# Patient Record
Sex: Female | Born: 1975 | Race: Black or African American | Hispanic: No | Marital: Single | Smoking: Never smoker
Health system: Southern US, Community
[De-identification: ages and names within clinical notes are randomized; demographics above are authoritative.]

## PROBLEM LIST (undated history)

## (undated) DIAGNOSIS — T7840XA Allergy, unspecified, initial encounter: Secondary | ICD-10-CM

## (undated) DIAGNOSIS — S0990XA Unspecified injury of head, initial encounter: Secondary | ICD-10-CM

## (undated) HISTORY — PX: NASAL SINUS SURGERY: SHX719

## (undated) HISTORY — PX: BRAIN SURGERY: SHX531

## (undated) HISTORY — DX: Allergy, unspecified, initial encounter: T78.40XA

## (undated) HISTORY — PX: OPEN TREATMENT ZYGOMATIC ARCH FRACTURE: SUR912

## (undated) HISTORY — PX: OTHER SURGICAL HISTORY: SHX169

---

## 1998-01-19 ENCOUNTER — Emergency Department (HOSPITAL_COMMUNITY): Admission: EM | Admit: 1998-01-19 | Discharge: 1998-01-19 | Payer: Self-pay | Admitting: Emergency Medicine

## 1999-10-18 ENCOUNTER — Encounter: Payer: Self-pay | Admitting: Family Medicine

## 1999-10-18 ENCOUNTER — Encounter: Admission: RE | Admit: 1999-10-18 | Discharge: 1999-10-18 | Payer: Self-pay | Admitting: Family Medicine

## 1999-10-19 ENCOUNTER — Encounter: Payer: Self-pay | Admitting: Family Medicine

## 1999-10-19 ENCOUNTER — Encounter: Admission: RE | Admit: 1999-10-19 | Discharge: 1999-10-19 | Payer: Self-pay | Admitting: Family Medicine

## 2001-01-09 ENCOUNTER — Encounter: Payer: Self-pay | Admitting: General Surgery

## 2001-01-09 ENCOUNTER — Inpatient Hospital Stay (HOSPITAL_COMMUNITY): Admission: AC | Admit: 2001-01-09 | Discharge: 2001-01-29 | Payer: Self-pay

## 2001-01-10 ENCOUNTER — Encounter: Payer: Self-pay | Admitting: General Surgery

## 2001-01-11 ENCOUNTER — Encounter: Payer: Self-pay | Admitting: Otolaryngology

## 2001-01-11 ENCOUNTER — Encounter: Payer: Self-pay | Admitting: General Surgery

## 2001-01-12 ENCOUNTER — Encounter: Payer: Self-pay | Admitting: General Surgery

## 2001-01-13 ENCOUNTER — Encounter: Payer: Self-pay | Admitting: General Surgery

## 2001-01-13 ENCOUNTER — Encounter: Payer: Self-pay | Admitting: Neurosurgery

## 2001-01-14 ENCOUNTER — Encounter: Payer: Self-pay | Admitting: General Surgery

## 2001-01-15 ENCOUNTER — Encounter: Payer: Self-pay | Admitting: General Surgery

## 2001-01-16 ENCOUNTER — Encounter: Payer: Self-pay | Admitting: General Surgery

## 2001-01-17 ENCOUNTER — Encounter: Payer: Self-pay | Admitting: General Surgery

## 2001-01-19 ENCOUNTER — Encounter: Payer: Self-pay | Admitting: General Surgery

## 2001-01-21 ENCOUNTER — Encounter: Payer: Self-pay | Admitting: General Surgery

## 2001-01-23 ENCOUNTER — Encounter: Payer: Self-pay | Admitting: General Surgery

## 2001-01-24 ENCOUNTER — Encounter: Payer: Self-pay | Admitting: General Surgery

## 2001-01-26 ENCOUNTER — Encounter: Payer: Self-pay | Admitting: General Surgery

## 2001-01-29 ENCOUNTER — Encounter: Payer: Self-pay | Admitting: General Surgery

## 2001-01-29 ENCOUNTER — Inpatient Hospital Stay (HOSPITAL_COMMUNITY)
Admission: RE | Admit: 2001-01-29 | Discharge: 2001-03-16 | Payer: Self-pay | Admitting: Physical Medicine and Rehabilitation

## 2001-02-07 ENCOUNTER — Encounter: Payer: Self-pay | Admitting: Physical Medicine & Rehabilitation

## 2001-02-11 ENCOUNTER — Encounter: Payer: Self-pay | Admitting: Physical Medicine and Rehabilitation

## 2001-03-23 ENCOUNTER — Encounter
Admission: RE | Admit: 2001-03-23 | Discharge: 2001-04-30 | Payer: Self-pay | Admitting: Physical Medicine & Rehabilitation

## 2001-10-07 ENCOUNTER — Other Ambulatory Visit: Admission: RE | Admit: 2001-10-07 | Discharge: 2001-10-07 | Payer: Self-pay | Admitting: Family Medicine

## 2002-06-23 ENCOUNTER — Other Ambulatory Visit: Admission: RE | Admit: 2002-06-23 | Discharge: 2002-06-23 | Payer: Self-pay | Admitting: Family Medicine

## 2003-06-28 ENCOUNTER — Other Ambulatory Visit: Admission: RE | Admit: 2003-06-28 | Discharge: 2003-06-28 | Payer: Self-pay | Admitting: Family Medicine

## 2004-06-18 ENCOUNTER — Emergency Department (HOSPITAL_COMMUNITY): Admission: EM | Admit: 2004-06-18 | Discharge: 2004-06-18 | Payer: Self-pay | Admitting: Emergency Medicine

## 2004-06-29 ENCOUNTER — Other Ambulatory Visit: Admission: RE | Admit: 2004-06-29 | Discharge: 2004-06-29 | Payer: Self-pay | Admitting: Family Medicine

## 2005-08-23 ENCOUNTER — Other Ambulatory Visit: Admission: RE | Admit: 2005-08-23 | Discharge: 2005-08-23 | Payer: Self-pay | Admitting: Family Medicine

## 2006-09-12 ENCOUNTER — Other Ambulatory Visit: Admission: RE | Admit: 2006-09-12 | Discharge: 2006-09-12 | Payer: Self-pay | Admitting: Family Medicine

## 2007-02-24 ENCOUNTER — Emergency Department (HOSPITAL_COMMUNITY): Admission: EM | Admit: 2007-02-24 | Discharge: 2007-02-24 | Payer: Self-pay | Admitting: Emergency Medicine

## 2007-10-23 ENCOUNTER — Other Ambulatory Visit: Admission: RE | Admit: 2007-10-23 | Discharge: 2007-10-23 | Payer: Self-pay | Admitting: Family Medicine

## 2008-10-04 ENCOUNTER — Other Ambulatory Visit: Admission: RE | Admit: 2008-10-04 | Discharge: 2008-10-04 | Payer: Self-pay | Admitting: Family Medicine

## 2009-10-06 ENCOUNTER — Other Ambulatory Visit: Admission: RE | Admit: 2009-10-06 | Discharge: 2009-10-06 | Payer: Self-pay | Admitting: Family Medicine

## 2010-05-22 ENCOUNTER — Encounter
Admission: RE | Admit: 2010-05-22 | Discharge: 2010-05-22 | Payer: Self-pay | Source: Home / Self Care | Attending: Family Medicine | Admitting: Family Medicine

## 2010-10-26 ENCOUNTER — Other Ambulatory Visit: Payer: Self-pay | Admitting: Family Medicine

## 2010-10-26 ENCOUNTER — Other Ambulatory Visit (HOSPITAL_COMMUNITY)
Admission: RE | Admit: 2010-10-26 | Discharge: 2010-10-26 | Disposition: A | Discharge status: Home / Self Care | Payer: Managed Care, Other (non HMO) | Source: Ambulatory Visit | Attending: Family Medicine | Admitting: Family Medicine

## 2010-10-26 DIAGNOSIS — Z124 Encounter for screening for malignant neoplasm of cervix: Secondary | ICD-10-CM | POA: Insufficient documentation
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
## 2011-09-08 ENCOUNTER — Emergency Department (HOSPITAL_COMMUNITY)
Admission: EM | Admit: 2011-09-08 | Discharge: 2011-09-08 | Disposition: A | Discharge status: Home / Self Care | Payer: No Typology Code available for payment source | Attending: Emergency Medicine | Admitting: Emergency Medicine

## 2011-09-08 ENCOUNTER — Encounter (HOSPITAL_COMMUNITY): Payer: Self-pay

## 2011-09-08 DIAGNOSIS — Y92410 Unspecified street and highway as the place of occurrence of the external cause: Secondary | ICD-10-CM | POA: Insufficient documentation

## 2011-09-08 DIAGNOSIS — S139XXA Sprain of joints and ligaments of unspecified parts of neck, initial encounter: Secondary | ICD-10-CM | POA: Insufficient documentation

## 2011-09-08 DIAGNOSIS — R071 Chest pain on breathing: Secondary | ICD-10-CM | POA: Insufficient documentation

## 2011-09-08 DIAGNOSIS — S161XXA Strain of muscle, fascia and tendon at neck level, initial encounter: Secondary | ICD-10-CM

## 2011-09-08 DIAGNOSIS — R0789 Other chest pain: Secondary | ICD-10-CM

## 2011-09-08 HISTORY — DX: Unspecified injury of head, initial encounter: S09.90XA

## 2011-09-08 MED ORDER — HYDROCODONE-ACETAMINOPHEN 5-325 MG PO TABS: 1.0000 | ORAL_TABLET | ORAL | Status: AC | PRN

## 2011-09-08 MED ORDER — IBUPROFEN 400 MG PO TABS: 400.0000 mg | ORAL_TABLET | Freq: Three times a day (TID) | ORAL | Status: AC

## 2011-09-08 MED ORDER — HYDROCODONE-ACETAMINOPHEN 5-325 MG PO TABS
1.0000 | ORAL_TABLET | Freq: Once | ORAL | Status: AC
  Administered 2011-09-08: 1 via ORAL
  Filled 2011-09-08: qty 1
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
## 2011-09-08 NOTE — ED Notes (Signed)
Pt is not in the general waiting area or triage. Unable to locate and has been called for multiple times
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
## 2011-09-10 ENCOUNTER — Emergency Department (HOSPITAL_COMMUNITY): Payer: No Typology Code available for payment source

## 2011-09-10 ENCOUNTER — Emergency Department (HOSPITAL_COMMUNITY)
Admission: EM | Admit: 2011-09-10 | Discharge: 2011-09-10 | Disposition: A | Discharge status: Home / Self Care | Payer: No Typology Code available for payment source | Attending: Emergency Medicine | Admitting: Emergency Medicine

## 2011-09-10 ENCOUNTER — Encounter (HOSPITAL_COMMUNITY): Payer: Self-pay | Admitting: Emergency Medicine

## 2011-09-10 DIAGNOSIS — R071 Chest pain on breathing: Secondary | ICD-10-CM | POA: Insufficient documentation

## 2011-09-10 DIAGNOSIS — R0789 Other chest pain: Secondary | ICD-10-CM

## 2011-09-10 DIAGNOSIS — Z79899 Other long term (current) drug therapy: Secondary | ICD-10-CM | POA: Insufficient documentation

## 2011-09-10 DIAGNOSIS — M25519 Pain in unspecified shoulder: Secondary | ICD-10-CM | POA: Insufficient documentation
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
## 2011-09-10 NOTE — ED Notes (Signed)
Patient returned from X-ray 

## 2011-09-10 NOTE — ED Notes (Signed)
Patient transported to X-ray 
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
## 2011-09-11 NOTE — ED Provider Notes (Signed)
Medical screening examination/treatment/procedure(s) were performed by non-physician practitioner and as supervising physician I was immediately available for consultation/collaboration.  Ethelda Chick, MD 09/11/11 [phone]

## 2011-11-11 ENCOUNTER — Encounter: Payer: Self-pay | Admitting: Emergency Medicine

## 2011-11-11 ENCOUNTER — Other Ambulatory Visit (HOSPITAL_COMMUNITY)
Admission: RE | Admit: 2011-11-11 | Discharge: 2011-11-11 | Disposition: A | Discharge status: Home / Self Care | Payer: Managed Care, Other (non HMO) | Source: Ambulatory Visit | Attending: Family Medicine | Admitting: Family Medicine

## 2011-11-11 ENCOUNTER — Ambulatory Visit (INDEPENDENT_AMBULATORY_CARE_PROVIDER_SITE_OTHER): Payer: Managed Care, Other (non HMO) | Admitting: Emergency Medicine

## 2011-11-11 VITALS — BP 110/74 | HR 76 | Temp 98.3°F | Ht 61.5 in | Wt 114.5 lb

## 2011-11-11 DIAGNOSIS — Z113 Encounter for screening for infections with a predominantly sexual mode of transmission: Secondary | ICD-10-CM | POA: Insufficient documentation

## 2011-11-11 DIAGNOSIS — Z124 Encounter for screening for malignant neoplasm of cervix: Secondary | ICD-10-CM

## 2011-11-11 DIAGNOSIS — Z0000 Encounter for general adult medical examination without abnormal findings: Secondary | ICD-10-CM

## 2011-11-11 DIAGNOSIS — Z01419 Encounter for gynecological examination (general) (routine) without abnormal findings: Secondary | ICD-10-CM | POA: Insufficient documentation

## 2011-11-11 NOTE — Patient Instructions (Signed)
It was nice to meet you!  Everything looks good today.  We will check some labs today.  You should receive a letter in the mail in the next 2 weeks with the results.  If anything is wrong, I will give you a call.  Please schedule an appointment either with me or the [facility] for IUD placement.

## 2011-11-11 NOTE — Assessment & Plan Note (Signed)
Doing well.  Will check lipid, HIV, and CMP.  Pap collected today.  Patient interested in IUD for birth control. Discussed risks and benefits as well as other options.  Patient would like to proceed with Mirena IUD.  She is to schedule an appointment with me or [facility] for placement.

## 2011-11-11 NOTE — Progress Notes (Signed)
  Subjective:    Patient ID: Katherine Woods, female    DOB: 1976/03/01, 36 y.o.   MRN: [id number]  HPI Katherine Woods is here for new patient appointment.  She has no acute concerns today, but would like to discuss birth control options.  I have reviewed and updated the following as appropriate: allergies, current medications, past family history, past medical history, past social history, past surgical history and problem list.  She is a never smoker, limited alcohol use.  Healthy weight.   Birth control: Interested in IUD.  Has been on the patch until 3 weeks ago.  Was on OCPs a long time ago, but had trouble remembering to take them.  LMP 3 weeks ago; no sex in 1 month.  No history of STDs.  No history of abnormal paps.  Regular periods.     Review of Systems  Constitutional: Negative for fever and appetite change.  Respiratory: Negative for shortness of breath.   Cardiovascular: Negative for chest pain.  Gastrointestinal: Negative for abdominal pain and diarrhea.  Genitourinary: Negative for difficulty urinating.  Musculoskeletal: Negative for myalgias and arthralgias.  Skin: Negative for rash.  Neurological: Negative for weakness and headaches.       Objective:   Physical Exam BP 110/74  Pulse 76  Temp(Src) 98.3 F (36.8 C) (Oral)  Ht 5' 1.5" (1.562 m)  Wt 114 lb 8 oz (51.937 kg)  BMI 21.28 kg/m2  LMP 10/30/2011 Gen: alert, cooperative, NAD HEENT: AT/[place], sclera white, EOMI, PERRL, MMM, no pharyngeal erythema or exudate Neck: no LAD, normal thyroid CV: RRR, no murmurs Pulm: CTAB, no wheezes or rales Abd: +BS, soft, NTND Ext: no edema, 2+ DP pulses Pelvic: normal external genitalia, normal vagina, normal cervix, moderate amount of white discharge present      Assessment & Plan:

## 2011-11-12 LAB — COMPREHENSIVE METABOLIC PANEL
ALT: 15 U/L (ref 0–35)
Alkaline Phosphatase: 41 U/L (ref 39–117)
CO2: 25 mEq/L (ref 19–32)
Creat: 0.66 mg/dL (ref 0.50–1.10)
Total Bilirubin: 0.5 mg/dL (ref 0.3–1.2)

## 2011-11-12 LAB — LIPID PANEL
HDL: 68 mg/dL (ref >39)
LDL Cholesterol: 159 mg/dL — ABNORMAL HIGH (ref 0–99)
Total CHOL/HDL Ratio: 3.5 Ratio
Triglycerides: 42 mg/dL (ref <150)

## 2011-11-12 LAB — HIV ANTIBODY (ROUTINE TESTING W REFLEX): HIV: NONREACTIVE

## 2011-11-13 ENCOUNTER — Encounter: Payer: Self-pay | Admitting: Emergency Medicine

## 2011-11-14 ENCOUNTER — Ambulatory Visit (INDEPENDENT_AMBULATORY_CARE_PROVIDER_SITE_OTHER): Payer: Managed Care, Other (non HMO) | Admitting: Family Medicine

## 2011-11-14 VITALS — BP 102/69 | HR 80 | Ht 61.5 in | Wt 114.0 lb

## 2011-11-14 DIAGNOSIS — Z309 Encounter for contraceptive management, unspecified: Secondary | ICD-10-CM

## 2011-11-14 LAB — POCT URINE PREGNANCY: Preg Test, Ur: NEGATIVE

## 2011-11-14 MED ORDER — LEVONORGESTREL 20 MCG/24HR IU IUD
INTRAUTERINE_SYSTEM | Freq: Once | INTRAUTERINE | Status: AC
  Administered 2011-11-14: 1 via INTRAUTERINE

## 2011-11-14 NOTE — Patient Instructions (Signed)
Please follow instructions below for IUD insertion and care after. For cramping, you may take over the counter Motrin or Tylenol as needed for pain. Schedule a follow up appointment with Dr. Elwyn Reach in 3-4 weeks to check for IUD strings.   Intrauterine Device Insertion Care After  Refer to this sheet in the next few weeks. These instructions provide you with information on caring for yourself after your procedure. Your caregiver may also give you more specific instructions. Your treatment has been planned according to current medical practices, but problems sometimes occur. Call your caregiver if you have any problems or questions after your procedure.  HOME CARE INSTRUCTIONS   Only take over-the-counter or prescription medicines for pain, discomfort, or fever as directed by your caregiver. Do not use aspirin. This may increase bleeding.   Check your IUD to make sure it is in place before you resume sexual activity. You should be able to feel the strings. If you cannot feel the strings, something may be wrong. The IUD may have fallen out of the uterus, or the uterus may have been punctured (perforated) during placement. Also, if the strings are getting longer, it may mean that the IUD is being forced out of the uterus. You no longer have full protection from pregnancy if any of these problems occur.   You may resume sexual intercourse if you are not having problems with the IUD. The IUD is considered immediately effective.   You may resume normal activities.   Keep all follow-up appointments to be sure your IUD has remained in place. After the first exam, yearly exams are advised, unless you cannot feel the strings of your IUD.   Continue to check that the IUD is still in place by feeling for the strings after every menstrual period.  SEEK MEDICAL CARE IF:   You have bleeding that is heavier or lasts longer than a normal menstrual cycle.   You have a fever.   You have increasing cramps or  abdominal pain not relieved with medicine.   You have abdominal pain that does not seem to be related to the same area of earlier cramping and pain.   You are lightheaded, unusually weak, or faint.   You have abnormal vaginal discharge or smells.   You have pain during sexual intercourse.   You cannot feel the IUD strings, or the IUD string has gotten longer.   You feel the IUD at the opening of the cervix in the vagina.   You think you are pregnant, or you miss your menstrual period.   The IUD string is hurting your sex partner.  Document Released: 01/23/2011 Document Revised: 05/16/2011 Document Reviewed: 01/23/2011 [facility] Patient Information 2012 [place], [place].

## 2011-11-19 NOTE — Progress Notes (Signed)
Patient ID: Katherine Woods, female   DOB: 1976-03-11, 36 y.o.   MRN: [id number] Here for IUD placement Denies pelvic pain or vaginal discharge Has no questions about IUD. IUD INSERTION: Patient given informed consent, signed copy in the chart..  Negative pregnancy confirmed.  Appropriate time out taken.   Sterile instruments and technique was used. Cervix brought into view with use of speculum and then cleansed three times with  betadine swabs.  A tenaculum was placed into the anterior lip of the cervix and a uterine sound was used to measure uterine size.   A mirena IUD was placed into the endometrial cavity, deployed and secured. The applicator was removed. The strings were trimmed to 2 centimeters.   There were no complications and the patient tolerated the procedure well.   She was given handouts for post procedure instructions and information about the IUD including a card with the time of recommended removal. Dr Vladimir Faster assisted.

## 2011-11-20 ENCOUNTER — Telehealth: Payer: Self-pay | Admitting: Emergency Medicine

## 2011-11-20 NOTE — Telephone Encounter (Signed)
Katherine Woods would like something in writing regarding result of her HIV test that she took at her visit.

## 2011-11-21 NOTE — Telephone Encounter (Signed)
Called pt and confirmed identity. Results up front for pick up. Katherine Woods, Katherine Woods

## 2011-12-05 ENCOUNTER — Encounter: Payer: Self-pay | Admitting: Emergency Medicine

## 2011-12-05 ENCOUNTER — Other Ambulatory Visit: Payer: Self-pay | Admitting: Emergency Medicine

## 2011-12-05 ENCOUNTER — Ambulatory Visit (INDEPENDENT_AMBULATORY_CARE_PROVIDER_SITE_OTHER): Payer: Managed Care, Other (non HMO) | Admitting: Emergency Medicine

## 2011-12-05 VITALS — BP 94/61 | HR 82 | Ht 61.5 in | Wt 120.0 lb

## 2011-12-05 DIAGNOSIS — Z30431 Encounter for routine checking of intrauterine contraceptive device: Secondary | ICD-10-CM

## 2011-12-05 NOTE — Progress Notes (Signed)
  Subjective:    Patient ID: Katherine Woods, female    DOB: [birth year], 36 y.o.   MRN: [id number]  HPI Katherine Woods is here for follow up of IUD placement.  IUD placed about 3 weeks ago.  No complaints.  Period started last Wednesday, still has a little spotting.  No increase in cramps.  Neither she nor her boyfriend have been able to feel the strings.   I have reviewed and updated the following as appropriate: allergies and current medications  Review of Systems See HPI    Objective:   Physical Exam BP 94/61  Pulse 82  Ht 5' 1.5" (1.562 m)  Wt 120 lb (54.432 kg)  BMI 22.31 kg/m2  LMP 11/28/2011 Gen: alert, cooperative, NAD Pelvic: external genitalia normal, vaginal normal, cervix visualized and appears normal, no strings seen      Assessment & Plan:

## 2011-12-05 NOTE — Patient Instructions (Addendum)
I could not see the IUD strings on exam.  We are going to set you up with an ultrasound at [facility] to confirm that the IUD is in place.  Follow up with me as needed.

## 2011-12-05 NOTE — Assessment & Plan Note (Addendum)
Tolerating IUD well; however, no strings visualized on exam.  Will get ultrasound to confirm placement.

## 2011-12-13 ENCOUNTER — Ambulatory Visit (HOSPITAL_COMMUNITY): Admission: RE | Admit: 2011-12-13 | Payer: Managed Care, Other (non HMO) | Source: Ambulatory Visit

## 2011-12-16 ENCOUNTER — Ambulatory Visit (HOSPITAL_COMMUNITY)
Admission: RE | Admit: 2011-12-16 | Discharge: 2011-12-16 | Disposition: A | Discharge status: Home / Self Care | Payer: Managed Care, Other (non HMO) | Source: Ambulatory Visit | Attending: Family Medicine | Admitting: Family Medicine

## 2011-12-16 ENCOUNTER — Ambulatory Visit (HOSPITAL_COMMUNITY): Payer: Managed Care, Other (non HMO)

## 2011-12-16 DIAGNOSIS — N83209 Unspecified ovarian cyst, unspecified side: Secondary | ICD-10-CM | POA: Insufficient documentation

## 2011-12-16 DIAGNOSIS — Z30431 Encounter for routine checking of intrauterine contraceptive device: Secondary | ICD-10-CM | POA: Insufficient documentation

## 2011-12-17 ENCOUNTER — Telehealth: Payer: Self-pay | Admitting: Emergency Medicine

## 2011-12-17 NOTE — Telephone Encounter (Signed)
Called and discussed results of ultrasound with patient.  Discussed expectations for future periods.  Answered patients questions.

## 2012-05-25 ENCOUNTER — Ambulatory Visit (INDEPENDENT_AMBULATORY_CARE_PROVIDER_SITE_OTHER): Payer: Managed Care, Other (non HMO) | Admitting: Family Medicine

## 2012-05-25 ENCOUNTER — Encounter: Payer: Self-pay | Admitting: Family Medicine

## 2012-05-25 VITALS — BP 102/56 | HR 86 | Temp 97.7°F | Ht 61.6 in | Wt 118.2 lb

## 2012-05-25 DIAGNOSIS — N898 Other specified noninflammatory disorders of vagina: Secondary | ICD-10-CM

## 2012-05-25 DIAGNOSIS — N760 Acute vaginitis: Secondary | ICD-10-CM

## 2012-05-25 LAB — POCT WET PREP (WET MOUNT): Clue Cells Wet Prep Whiff POC: POSITIVE

## 2012-05-25 NOTE — Progress Notes (Signed)
  Subjective:    Patient ID: Katherine Woods, female    DOB: [birth year], 36 y.o.   MRN: [id number]  HPI  1. Vaginal itching. Just ending menstrual cycle. Noticed itching 2 days ago after she left a tampon in most of the day because she was sleeping. Slight discharge with pink tinge still. Started topical monistat. Has history frequent BV. Had normal cervical ctx in June. Monogamous with BF.  Denies pain, fever, chills, dysuria.  Review of Systems See HPI otherwise negative.  reports that she has never smoked. She has never used smokeless tobacco.     Objective:   Physical Exam  Vitals reviewed. Constitutional: She is oriented to person, place, and time. She appears well-developed and well-nourished. No distress.  HENT:  Head: Normocephalic and atraumatic.  Mouth/Throat: Oropharynx is clear and moist.  Eyes: EOM are normal. Pupils are equal, round, and reactive to light.  Pulmonary/Chest: Effort normal.  Genitourinary: Vagina normal.       No rash or lesion. Pink tinged discharge noted. No odor.  No mucosal irritation or rash. Cervix wnl.  Neurological: She is alert and oriented to person, place, and time.  Skin: No rash noted. She is not diaphoretic.  Psychiatric: She has a normal mood and affect.          Assessment & Plan:

## 2012-05-25 NOTE — Assessment & Plan Note (Signed)
BV vs yeast infection. F/u wet prep. No risk factors for STDs, will not repeat cultures. Continue monistat and f/u results.

## 2012-05-25 NOTE — Patient Instructions (Addendum)
Will call with results.  Keep using monistat for now.

## 2012-05-26 ENCOUNTER — Telehealth: Payer: Self-pay | Admitting: Family Medicine

## 2012-05-26 MED ORDER — METRONIDAZOLE 500 MG PO TABS: 500.0000 mg | ORAL_TABLET | Freq: Two times a day (BID) | ORAL | Status: DC

## 2012-05-26 NOTE — Telephone Encounter (Signed)
Informed patient: Test shows trichomonas. Needs flagyl x 7 days, partner requires treatment. Use condoms for protection from this and other STDs. She understands.

## 2012-06-04 ENCOUNTER — Telehealth: Payer: Self-pay | Admitting: Family Medicine

## 2012-06-04 NOTE — Telephone Encounter (Signed)
Pt calling wanting clarification of results. She stated that when her partner got tested he did not have trich(she went him when he got tested)and she does not understand how she could have trich if he did not have it. She did finish her meds yesterday however would like for Dr. Cristal Ford to call her to call her back.Loralee [place]

## 2012-06-05 NOTE — Telephone Encounter (Signed)
Returned call and answered questions. Her partner was treated also. Advised her if symptoms do not resolve or they return, to come back for repeat testing and needs repeat STD testing at physical or sooner if symptomatic.

## 2012-08-07 ENCOUNTER — Ambulatory Visit (INDEPENDENT_AMBULATORY_CARE_PROVIDER_SITE_OTHER): Payer: Managed Care, Other (non HMO) | Admitting: Family Medicine

## 2012-08-07 ENCOUNTER — Encounter: Payer: Self-pay | Admitting: Family Medicine

## 2012-08-07 VITALS — BP 101/60 | HR 76 | Temp 98.0°F | Ht 61.5 in | Wt 118.1 lb

## 2012-08-07 DIAGNOSIS — D649 Anemia, unspecified: Secondary | ICD-10-CM

## 2012-08-07 DIAGNOSIS — R5383 Other fatigue: Secondary | ICD-10-CM

## 2012-08-07 DIAGNOSIS — R5381 Other malaise: Secondary | ICD-10-CM

## 2012-08-07 LAB — CBC WITH DIFFERENTIAL/PLATELET
Basophils Absolute: 0 10*3/uL (ref 0.0–0.1)
Eosinophils Absolute: 0.1 10*3/uL (ref 0.0–0.7)
Eosinophils Relative: 2 % (ref 0–5)
MCH: 29.6 pg (ref 26.0–34.0)
MCHC: 34.6 g/dL (ref 30.0–36.0)
MCV: 85.5 fL (ref 78.0–100.0)
Platelets: 287 10*3/uL (ref 150–400)
RDW: 13.1 % (ref 11.5–15.5)

## 2012-08-07 NOTE — Patient Instructions (Signed)
Katherine Woods, it was nice meeting you today.  We will go ahead and check some lab work on you including a blood count to look for anemia, and also some vitamins including B12 and folate as well as a TSH which looks at your thyroid.  I will let Dr. Elwyn Reach know about what we talked about.  Thanks, Dr. Paulina Fusi   Iron Deficiency Anemia There are many types of anemia. Iron deficiency anemia is the most common. Iron deficiency anemia is a decrease in the number of red blood cells caused by too little iron. Without enough iron, your body does not produce enough hemoglobin. Hemoglobin is a substance in red blood cells that carries oxygen to the body's tissues. Iron deficiency anemia may leave you tired and short of breath. CAUSES   Lack of iron in the diet.  This may be seen in infants and children, because there is little iron in milk.  This may be seen in adults who do not eat enough iron-rich foods.  This may be seen in pregnant or breastfeeding women who do not take iron supplements. There is a much higher need for iron intake at these times.  Poor absorption of iron, as seen with intestinal disorders.  Intestinal bleeding.  Heavy periods. SYMPTOMS  Mild anemia may not be noticeable. Symptoms may include:  Fatigue.  Headache.  Pale skin.  Weakness.  Shortness of breath.  Dizziness.  Cold hands and feet.  Fast or irregular heartbeat. DIAGNOSIS  Diagnosis requires a thorough evaluation and physical exam by your caregiver.  Blood tests are generally used to confirm iron deficiency anemia.  Additional tests may be done to find the underlying cause of your anemia. These may include:  Testing for blood in the stool (fecal occult blood test).  A procedure to see inside the colon and rectum (colonoscopy).  A procedure to see inside the esophagus and stomach (endoscopy). TREATMENT   Correcting the cause of the iron deficiency is the first step.  Medicines, such as oral  contraceptives, can make heavy menstrual flows lighter.  Antibiotics and other medicines can be used to treat peptic ulcers.  Surgery may be needed to remove a bleeding polyp, tumor, or fibroid.  Often, iron supplements (ferrous sulfate) are taken.  For the best iron absorption, take these supplements with an empty stomach.  You may need to take the supplements with food if you cannot tolerate them on an empty stomach. Vitamin C improves the absorption of iron. Your caregiver may recommend taking your iron tablets with a glass of orange juice or vitamin C supplement.  Milk and antacids should not be taken at the same time as iron supplements. They may interfere with the absorption of iron.  Iron supplements can cause constipation. A stool softener is often recommended.  Pregnant and breastfeeding women will need to take extra iron, because their normal diet usually will not provide the required amount.  Patients who cannot tolerate iron by mouth can take it through a vein (intravenously) or by an injection into the muscle. HOME CARE INSTRUCTIONS   Ask your dietitian for help with diet questions.  Take iron and vitamins as directed by your caregiver.  Eat a diet rich in iron. Eat liver, lean beef, whole-grain bread, eggs, dried fruit, and dark green leafy vegetables. SEEK IMMEDIATE MEDICAL CARE IF:   You have a fainting episode. Do not drive yourself. Call your local emergency services (911 in [place] if no other help is available.  You have chest  pain, nausea, or vomiting.  You develop severe or increased shortness of breath with activities.  You develop weakness or increased thirst.  You have a rapid heartbeat.  You develop unexplained sweating or become lightheaded when getting up from a chair or bed. MAKE SURE YOU:   Understand these instructions.  Will watch your condition.  Will get help right away if you are not doing well or get worse. Document Released: 05/24/2000  Document Revised: 08/19/2011 Document Reviewed: 10/03/2009 [facility] Patient Information 2013 [place], [place].
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
## 2012-08-07 NOTE — Assessment & Plan Note (Addendum)
Pt c/o generalized fatigue for several years.  Previous Hx of Fe deficiency anemia and no evidence of pallor or koilonychia on exam. Will go ahead and get CBC, TSH, B12, Folate today.  No S/Sx of depression (PHQ 2 negative) and thin lady so OSA low likelihood.  Does work nightshift and if all tests come back negative, would consider starting Provigil for work.  If this is the case, pt would need to be seen a month from now.

## 2012-08-08 ENCOUNTER — Encounter: Payer: Self-pay | Admitting: Family Medicine

## 2012-08-12 ENCOUNTER — Telehealth: Payer: Self-pay | Admitting: Emergency Medicine

## 2012-08-12 NOTE — Telephone Encounter (Signed)
Wants to know about increasing her iron (script) and wants to know what else she can do to boost her energy   Alt # [phone]

## 2012-08-12 NOTE — Telephone Encounter (Signed)
Pt states she is taking 25mg  tabs (3 of them daily). Can she increase to boost her energy?

## 2012-08-14 NOTE — Telephone Encounter (Signed)
Called and spoke with patient.  Discussed that her labs from 2/28 were all normal.  It is okay to continue the iron.  Suggested taking it with orange juice.  She can also try on OTC B complex vitamin to see if that will help her energy level.  Encouraged her to come in for an appointment to discuss medication options, such as provigil.  She will schedule an appointment to see me in the next few weeks.

## 2013-03-12 ENCOUNTER — Telehealth: Payer: Self-pay | Admitting: Emergency Medicine

## 2013-03-12 ENCOUNTER — Encounter: Payer: Self-pay | Admitting: Emergency Medicine

## 2013-03-12 ENCOUNTER — Ambulatory Visit (INDEPENDENT_AMBULATORY_CARE_PROVIDER_SITE_OTHER): Payer: Managed Care, Other (non HMO) | Admitting: Emergency Medicine

## 2013-03-12 VITALS — BP 104/68 | HR 88 | Temp 98.4°F | Ht 62.0 in | Wt 108.5 lb

## 2013-03-12 DIAGNOSIS — N898 Other specified noninflammatory disorders of vagina: Secondary | ICD-10-CM

## 2013-03-12 DIAGNOSIS — R5381 Other malaise: Secondary | ICD-10-CM

## 2013-03-12 LAB — POCT WET PREP (WET MOUNT): Clue Cells Wet Prep Whiff POC: POSITIVE

## 2013-03-12 MED ORDER — METRONIDAZOLE 500 MG PO TABS: 500.0000 mg | ORAL_TABLET | Freq: Two times a day (BID) | ORAL | Status: DC

## 2013-03-12 MED ORDER — MELATONIN 5 MG PO TABS: 5.0000 mg | ORAL_TABLET | Freq: Every day | ORAL | Status: DC

## 2013-03-12 NOTE — Assessment & Plan Note (Addendum)
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 Depression screen negative. Recent lab work in 07/2012 (TSH, CMP, CBC, B12, folate) was unremarkable. Discussed options of stimulant medications vs sleep aids. Will start with melatonin 5mg  to be taken 30 minutes before sleep. Follow up in 1 month.

## 2013-03-12 NOTE — Patient Instructions (Addendum)
It was nice to see you!  I will call you if anything shows up in the discharge.  I think it is likely from having an IUD.  Take the melatonin about 30 minutes before you want to go to bed.  Follow up in 1 month to see how things are going.

## 2013-03-12 NOTE — Telephone Encounter (Signed)
Called and spoke with patient regarding wet prep results.  Will treat BV with flagyl 500mg  BID x7 days.

## 2013-03-12 NOTE — Assessment & Plan Note (Signed)
Suspect secondary to IUD in place. Wet prep collected today.  Will call with the results.

## 2013-03-12 NOTE — Progress Notes (Signed)
  Subjective:    Patient ID: Katherine Woods, female    DOB: [birth year], 37 y.o.   MRN: [id number]  HPI Katherine Woods is here for a vaginal discharge and fatigue.  Vaginal discharge Reports intermittent discharge since IUD was placed.  Describes it as thin and clear to white.  No vaginal itching.  No pelvic pain.  No new sexual partners.  Fatigue She reports feeling tired and fatigued all the time.  She denies depressed mood, anhedonia, trouble concentrating, SI/HI.  Only complains of decreased energy and feeling tired.  She was seen for this previously by Dr. Paulina Fusi who did a work up that was normal.  She does work third shift, from 6:30pm to somewhere between 4-6am.    I have reviewed and updated the following as appropriate: allergies and current medications SHx: non smoker  Review of Systems See HPI    Objective:   Physical Exam BP 104/68  Pulse 88  Temp(Src) 98.4 F (36.9 C) (Oral)  Ht 5\' 2"  (1.575 m)  Wt 108 lb 8 oz (49.215 kg)  BMI 19.84 kg/m2 Gen: alert, cooperative, NAD HEENT: AT/[place], sclera white, MMM Neck: supple CV: RRR, no murmurs Pulm: CTAB, no wheezes or rales Ext: no edema Pelvic: normal external genitalia; normal vagina; no discharge seen; normal cervix     Assessment & Plan:

## 2013-04-15 ENCOUNTER — Other Ambulatory Visit: Payer: Self-pay

## 2013-12-12 ENCOUNTER — Encounter (HOSPITAL_COMMUNITY): Payer: Self-pay | Admitting: Emergency Medicine

## 2013-12-12 ENCOUNTER — Emergency Department (HOSPITAL_COMMUNITY)
Admission: EM | Admit: 2013-12-12 | Discharge: 2013-12-12 | Disposition: A | Discharge status: Home / Self Care | Payer: BC Managed Care – PPO | Attending: Emergency Medicine | Admitting: Emergency Medicine

## 2013-12-12 DIAGNOSIS — Y9289 Other specified places as the place of occurrence of the external cause: Secondary | ICD-10-CM | POA: Insufficient documentation

## 2013-12-12 DIAGNOSIS — Y9389 Activity, other specified: Secondary | ICD-10-CM | POA: Insufficient documentation

## 2013-12-12 DIAGNOSIS — IMO0001 Reserved for inherently not codable concepts without codable children: Secondary | ICD-10-CM | POA: Insufficient documentation

## 2013-12-12 DIAGNOSIS — W57XXXA Bitten or stung by nonvenomous insect and other nonvenomous arthropods, initial encounter: Secondary | ICD-10-CM

## 2013-12-12 DIAGNOSIS — Z87828 Personal history of other (healed) physical injury and trauma: Secondary | ICD-10-CM | POA: Insufficient documentation

## 2013-12-12 NOTE — ED Notes (Signed)
Pt reports seeing a small black bug bite her Lt hand on 7-4 -15. On assessment Lt hand swelling present and skin is red at reported site of bug bite.
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
## 2013-12-12 NOTE — ED Notes (Signed)
Declined W/C at D/C and was escorted to lobby by RN. 

## 2013-12-12 NOTE — Discharge Instructions (Signed)
Take Benadryl as needed.

## 2013-12-15 NOTE — ED Provider Notes (Signed)
Medical screening examination/treatment/procedure(s) were performed by non-physician practitioner and as supervising physician I was immediately available for consultation/collaboration.   EKG Interpretation None        Blanchie Dessert, MD 12/15/13 1442

## 2013-12-17 ENCOUNTER — Other Ambulatory Visit (HOSPITAL_COMMUNITY)
Admission: RE | Admit: 2013-12-17 | Discharge: 2013-12-17 | Disposition: A | Discharge status: Home / Self Care | Payer: BC Managed Care – PPO | Source: Ambulatory Visit | Attending: Family Medicine | Admitting: Family Medicine

## 2013-12-17 ENCOUNTER — Ambulatory Visit (INDEPENDENT_AMBULATORY_CARE_PROVIDER_SITE_OTHER): Payer: BC Managed Care – PPO | Admitting: Family Medicine

## 2013-12-17 VITALS — BP 100/67 | HR 89 | Temp 97.4°F | Ht 61.5 in | Wt 113.0 lb

## 2013-12-17 DIAGNOSIS — R5382 Chronic fatigue, unspecified: Secondary | ICD-10-CM

## 2013-12-17 DIAGNOSIS — T8389XA Other specified complication of genitourinary prosthetic devices, implants and grafts, initial encounter: Secondary | ICD-10-CM

## 2013-12-17 DIAGNOSIS — N760 Acute vaginitis: Secondary | ICD-10-CM | POA: Insufficient documentation

## 2013-12-17 DIAGNOSIS — Z0000 Encounter for general adult medical examination without abnormal findings: Secondary | ICD-10-CM

## 2013-12-17 DIAGNOSIS — R5381 Other malaise: Secondary | ICD-10-CM

## 2013-12-17 DIAGNOSIS — T8332XA Displacement of intrauterine contraceptive device, initial encounter: Secondary | ICD-10-CM

## 2013-12-17 DIAGNOSIS — R5383 Other fatigue: Secondary | ICD-10-CM

## 2013-12-17 DIAGNOSIS — Z113 Encounter for screening for infections with a predominantly sexual mode of transmission: Secondary | ICD-10-CM | POA: Insufficient documentation

## 2013-12-17 DIAGNOSIS — Z23 Encounter for immunization: Secondary | ICD-10-CM

## 2013-12-17 LAB — CBC
HCT: 33.5 % — ABNORMAL LOW (ref 36.0–46.0)
HEMOGLOBIN: 11.9 g/dL — AB (ref 12.0–15.0)
MCH: 30.4 pg (ref 26.0–34.0)
MCHC: 35.5 g/dL (ref 30.0–36.0)
MCV: 85.7 fL (ref 78.0–100.0)
Platelets: 307 10*3/uL (ref 150–400)
RBC: 3.91 MIL/uL (ref 3.87–5.11)
RDW: 13.5 % (ref 11.5–15.5)
WBC: 6.2 10*3/uL (ref 4.0–10.5)

## 2013-12-17 NOTE — Progress Notes (Addendum)
Patient ID: Katherine Woods, female   DOB: 1976-03-22, 38 y.o.   MRN: [id number]  HPI:  Patient presents today for a well woman exam.   Concerns today: wants STD testing Periods: gets periods monthly, tolerable Contraception: IUD mirena June 2013 Pelvic symptoms: no abnormal discharge, no pain Sexual activity: sexually active with one partner STD Screening: would like this today Pap smear status: normal in June 2013 (no HPV testing) Exercise: does not exercise Diet: 1-2 meals per day  Fatigue - feels tired a lot and sleeps a lot. No weight changes. No chest pain or shortness of breath.  ROS: See HPI  [place]:  Cancers in family: none Grandmother with hx of glaucoma No smoke, occasional alcohol, no drugs  PHYSICAL EXAM: BP 100/67  Pulse 89  Temp(Src) 97.4 F (36.3 C) (Oral)  Ht 5' 1.5" (1.562 m)  Wt 113 lb (51.256 kg)  BMI 21.01 kg/m2  LMP 12/10/2013  Gen: NAD, pleasant, cooperative HEENT: NCAT, PERRL, no palpable thyromegaly or anterior cervical lymphadenopathy Heart: RRR, no murmurs Lungs: CTAB, NWOB Abdomen: soft, nontender to palpation Neuro: grossly nonfocal, speech normal GU: normal appearing external genitalia without lesions. Vagina is moist with white discharge. Cervix normal in appearance but turned towards the right side. No cervical motion tenderness or tenderness on bimanual exam. No adnexal masses but there is fullness in the pelvis which is difficult to characterize.  ASSESSMENT/PLAN:  # Health maintenance:  -STD screening: will check gc/chlamydia/trichomonas, HIV, RPR, acute hepatitis panel today  -pap smear: due in one year -lipid screening: not due -tetanus shot given today -handout given on health maintenance topics  # Possible malpositioned IUD: strings not visualized on exam. Will check pelvic u/s to locate IUD, also to characterize position and location of uterus/ovaries given fullness on pelvic exam.  FOLLOW UP: F/u in 1 month for  fatigue.  [employer]. Ardelia Mems, [place]

## 2013-12-17 NOTE — Assessment & Plan Note (Signed)
Did not have time to fully address this today. Pt reporting fatigue. Will start workup with CBC and TSH (not checked in 1.5 years). F/u in 1 month to discuss further.

## 2013-12-17 NOTE — Patient Instructions (Addendum)
It was nice to meet you today!  We are checking an ultrasound of your pelvis to look for your IUD and evaluate your pelvic anatomy. We will call you with the results of your tests. Follow up in 1 month to discuss your fatigue in greater detail.  Be well, Dr. Ardelia Mems   Health Maintenance, Female A healthy lifestyle and preventative care can promote health and wellness.  Maintain regular health, dental, and eye exams.  Eat a healthy diet. Foods like vegetables, fruits, whole grains, low-fat dairy products, and lean protein foods contain the nutrients you need without too many calories. Decrease your intake of foods high in solid fats, added sugars, and salt. Get information about a proper diet from your caregiver, if necessary.  Regular physical exercise is one of the most important things you can do for your health. Most adults should get at least 150 minutes of moderate-intensity exercise (any activity that increases your heart rate and causes you to sweat) each week. In addition, most adults need muscle-strengthening exercises on 2 or more days a week.   Maintain a healthy weight. The body mass index (BMI) is a screening tool to identify possible weight problems. It provides an estimate of body fat based on height and weight. Your caregiver can help determine your BMI, and can help you achieve or maintain a healthy weight. For adults 20 years and older:  A BMI below 18.5 is considered underweight.  A BMI of 18.5 to 24.9 is normal.  A BMI of 25 to 29.9 is considered overweight.  A BMI of 30 and above is considered obese.  Maintain normal blood lipids and cholesterol by exercising and minimizing your intake of saturated fat. Eat a balanced diet with plenty of fruits and vegetables. Blood tests for lipids and cholesterol should begin at age 79 and be repeated every 5 years. If your lipid or cholesterol levels are high, you are over 50, or you are a high risk for heart disease, you may  need your cholesterol levels checked more frequently.Ongoing high lipid and cholesterol levels should be treated with medicines if diet and exercise are not effective.  If you smoke, find out from your caregiver how to quit. If you do not use tobacco, do not start.  Lung cancer screening is recommended for adults aged 65-80 years who are at high risk for developing lung cancer because of a history of smoking. Yearly low-dose computed tomography (CT) is recommended for people who have at least a 30-pack-year history of smoking and are a current smoker or have quit within the past 15 years. A pack year of smoking is smoking an average of 1 pack of cigarettes a day for 1 year (for example: 1 pack a day for 30 years or 2 packs a day for 15 years). Yearly screening should continue until the smoker has stopped smoking for at least 15 years. Yearly screening should also be stopped for people who develop a health problem that would prevent them from having lung cancer treatment.  If you are pregnant, do not drink alcohol. If you are breastfeeding, be very cautious about drinking alcohol. If you are not pregnant and choose to drink alcohol, do not exceed 1 drink per day. One drink is considered to be 12 ounces (355 mL) of beer, 5 ounces (148 mL) of wine, or 1.5 ounces (44 mL) of liquor.  Avoid use of street drugs. Do not share needles with anyone. Ask for help if you need support or instructions  about stopping the use of drugs.  High blood pressure causes heart disease and increases the risk of stroke. Blood pressure should be checked at least every 1 to 2 years. Ongoing high blood pressure should be treated with medicines, if weight loss and exercise are not effective.  If you are 83 to 38 years old, ask your caregiver if you should take aspirin to prevent strokes.  Diabetes screening involves taking a blood sample to check your fasting blood sugar level. This should be done once every 3 years, after age 54,  if you are within normal weight and without risk factors for diabetes. Testing should be considered at a younger age or be carried out more frequently if you are overweight and have at least 1 risk factor for diabetes.  Breast cancer screening is essential preventative care for women. You should practice "breast self-awareness." This means understanding the normal appearance and feel of your breasts and may include breast self-examination. Any changes detected, no matter how small, should be reported to a caregiver. Women in their 68s and 30s should have a clinical breast exam (CBE) by a caregiver as part of a regular health exam every 1 to 3 years. After age 5, women should have a CBE every year. Starting at age 60, women should consider having a mammogram (breast X-ray) every year. Women who have a family history of breast cancer should talk to their caregiver about genetic screening. Women at a high risk of breast cancer should talk to their caregiver about having an MRI and a mammogram every year.  Breast cancer gene (BRCA)-related cancer risk assessment is recommended for women who have family members with BRCA-related cancers. BRCA-related cancers include breast, ovarian, tubal, and peritoneal cancers. Having family members with these cancers may be associated with an increased risk for harmful changes (mutations) in the breast cancer genes BRCA1 and BRCA2. Results of the assessment will determine the need for genetic counseling and BRCA1 and BRCA2 testing.  The Pap test is a screening test for cervical cancer. Women should have a Pap test starting at age 52. Between ages 57 and 19, Pap tests should be repeated every 2 years. Beginning at age 41, you should have a Pap test every 3 years as long as the past 3 Pap tests have been normal. If you had a hysterectomy for a problem that was not cancer or a condition that could lead to cancer, then you no longer need Pap tests. If you are between ages 75 and  15, and you have had normal Pap tests going back 10 years, you no longer need Pap tests. If you have had past treatment for cervical cancer or a condition that could lead to cancer, you need Pap tests and screening for cancer for at least 20 years after your treatment. If Pap tests have been discontinued, risk factors (such as a new sexual partner) need to be reassessed to determine if screening should be resumed. Some women have medical problems that increase the chance of getting cervical cancer. In these cases, your caregiver may recommend more frequent screening and Pap tests.  The human papillomavirus (HPV) test is an additional test that may be used for cervical cancer screening. The HPV test looks for the virus that can cause the cell changes on the cervix. The cells collected during the Pap test can be tested for HPV. The HPV test could be used to screen women aged 77 years and older, and should be used in women of  any age who have unclear Pap test results. After the age of 42, women should have HPV testing at the same frequency as a Pap test.  Colorectal cancer can be detected and often prevented. Most routine colorectal cancer screening begins at the age of 65 and continues through age 65. However, your caregiver may recommend screening at an earlier age if you have risk factors for colon cancer. On a yearly basis, your caregiver may provide home test kits to check for hidden blood in the stool. Use of a small camera at the end of a tube, to directly examine the colon (sigmoidoscopy or colonoscopy), can detect the earliest forms of colorectal cancer. Talk to your caregiver about this at age 24, when routine screening begins. Direct examination of the colon should be repeated every 5 to 10 years through age 15, unless early forms of pre-cancerous polyps or small growths are found.  Hepatitis C blood testing is recommended for all people born from [birth year] through 1965 and any individual with known risks  for hepatitis C.  Practice safe sex. Use condoms and avoid high-risk sexual practices to reduce the spread of sexually transmitted infections (STIs). Sexually active women aged 49 and younger should be checked for Chlamydia, which is a common sexually transmitted infection. Older women with new or multiple partners should also be tested for Chlamydia. Testing for other STIs is recommended if you are sexually active and at increased risk.  Osteoporosis is a disease in which the bones lose minerals and strength with aging. This can result in serious bone fractures. The risk of osteoporosis can be identified using a bone density scan. Women ages 101 and over and women at risk for fractures or osteoporosis should discuss screening with their caregivers. Ask your caregiver whether you should be taking a calcium supplement or vitamin D to reduce the rate of osteoporosis.  Menopause can be associated with physical symptoms and risks. Hormone replacement therapy is available to decrease symptoms and risks. You should talk to your caregiver about whether hormone replacement therapy is right for you.  Use sunscreen. Apply sunscreen liberally and repeatedly throughout the day. You should seek shade when your shadow is shorter than you. Protect yourself by wearing long sleeves, pants, a wide-brimmed hat, and sunglasses year round, whenever you are outdoors.  Notify your caregiver of new moles or changes in moles, especially if there is a change in shape or color. Also notify your caregiver if a mole is larger than the size of a pencil eraser.  Stay current with your immunizations. Document Released: 12/10/2010 Document Revised: 09/21/2012 Document Reviewed: 04/28/2013 [facility] Patient Information 2015 [place], [place]. This information is not intended to replace advice given to you by your health care provider. Make sure you discuss any questions you have with your health care provider.

## 2013-12-18 LAB — HEPATITIS PANEL, ACUTE
HCV Ab: NEGATIVE
HEP A IGM: NONREACTIVE
Hep B C IgM: NONREACTIVE
Hepatitis B Surface Ag: NEGATIVE

## 2013-12-18 LAB — HIV ANTIBODY (ROUTINE TESTING W REFLEX): HIV 1&2 Ab, 4th Generation: NONREACTIVE

## 2013-12-18 LAB — TSH: TSH: 1.827 u[IU]/mL (ref 0.350–4.500)

## 2013-12-18 LAB — RPR

## 2013-12-20 ENCOUNTER — Telehealth: Payer: Self-pay | Admitting: Family Medicine

## 2013-12-20 MED ORDER — METRONIDAZOLE 500 MG PO TABS: 500.0000 mg | ORAL_TABLET | Freq: Two times a day (BID) | ORAL | Status: DC

## 2013-12-20 NOTE — Telephone Encounter (Signed)
Called pt to discuss +trichomonas result. Will rx flagyl 500mg  BID x 7 days. Discussed importance of partner also being treated, and waiting 7 days after they are BOTH treated before resuming sexual activity. Also advised no alcohol while taking flagyl. Pt understood these instructions.  She asked about when and where her ultrasound appt is. Will route message to red team to call her and explain this.  Leeanne Rio, MD

## 2013-12-20 NOTE — Telephone Encounter (Signed)
Pt called back and would like Dr. Ardelia Mems to call her back again. jw

## 2013-12-20 NOTE — Telephone Encounter (Signed)
Called patient and explained the ultrasound procedure to her and the date, time and location of the appointment. Thanks, [employer]

## 2013-12-28 ENCOUNTER — Encounter: Payer: Self-pay | Admitting: Family Medicine

## 2013-12-28 ENCOUNTER — Ambulatory Visit (HOSPITAL_COMMUNITY)
Admission: RE | Admit: 2013-12-28 | Discharge: 2013-12-28 | Disposition: A | Discharge status: Home / Self Care | Payer: BC Managed Care – PPO | Source: Ambulatory Visit | Attending: Family Medicine | Admitting: Family Medicine

## 2013-12-28 DIAGNOSIS — Y849 Medical procedure, unspecified as the cause of abnormal reaction of the patient, or of later complication, without mention of misadventure at the time of the procedure: Secondary | ICD-10-CM | POA: Insufficient documentation

## 2013-12-28 DIAGNOSIS — N83209 Unspecified ovarian cyst, unspecified side: Secondary | ICD-10-CM | POA: Insufficient documentation

## 2013-12-28 DIAGNOSIS — T8389XA Other specified complication of genitourinary prosthetic devices, implants and grafts, initial encounter: Secondary | ICD-10-CM | POA: Insufficient documentation

## 2013-12-28 DIAGNOSIS — T8332XA Displacement of intrauterine contraceptive device, initial encounter: Secondary | ICD-10-CM

## 2014-01-28 ENCOUNTER — Ambulatory Visit: Payer: BC Managed Care – PPO | Admitting: Family Medicine

## 2014-02-10 ENCOUNTER — Ambulatory Visit (INDEPENDENT_AMBULATORY_CARE_PROVIDER_SITE_OTHER): Payer: BC Managed Care – PPO | Admitting: Family Medicine

## 2014-02-10 ENCOUNTER — Other Ambulatory Visit (HOSPITAL_COMMUNITY)
Admission: RE | Admit: 2014-02-10 | Discharge: 2014-02-10 | Disposition: A | Discharge status: Home / Self Care | Payer: BC Managed Care – PPO | Source: Ambulatory Visit | Attending: Family Medicine | Admitting: Family Medicine

## 2014-02-10 ENCOUNTER — Encounter: Payer: Self-pay | Admitting: Family Medicine

## 2014-02-10 VITALS — BP 98/54 | HR 76 | Temp 98.3°F | Ht 62.0 in | Wt 115.8 lb

## 2014-02-10 DIAGNOSIS — A5901 Trichomonal vulvovaginitis: Secondary | ICD-10-CM | POA: Insufficient documentation

## 2014-02-10 DIAGNOSIS — A599 Trichomoniasis, unspecified: Secondary | ICD-10-CM | POA: Diagnosis not present

## 2014-02-10 DIAGNOSIS — Z113 Encounter for screening for infections with a predominantly sexual mode of transmission: Secondary | ICD-10-CM | POA: Diagnosis present

## 2014-02-10 DIAGNOSIS — N760 Acute vaginitis: Secondary | ICD-10-CM | POA: Diagnosis not present

## 2014-02-10 LAB — POCT WET PREP (WET MOUNT): CLUE CELLS WET PREP WHIFF POC: POSITIVE

## 2014-02-10 MED ORDER — METRONIDAZOLE 500 MG PO TABS: 1000.0000 mg | ORAL_TABLET | Freq: Two times a day (BID) | ORAL | Status: DC

## 2014-02-10 NOTE — Patient Instructions (Signed)
Trichomoniasis Trichomoniasis is an infection caused by an organism called Trichomonas. The infection can affect both women and men. In women, the outer female genitalia and the vagina are affected. In men, the penis is mainly affected, but the prostate and other reproductive organs can also be involved. Trichomoniasis is a sexually transmitted infection (STI) and is most often passed to another person through sexual contact.  RISK FACTORS  Having unprotected sexual intercourse.  Having sexual intercourse with an infected partner. SIGNS AND SYMPTOMS  Symptoms of trichomoniasis in women include:  Abnormal gray-green frothy vaginal discharge.  Itching and irritation of the vagina.  Itching and irritation of the area outside the vagina. Symptoms of trichomoniasis in men include:   Penile discharge with or without pain.  Pain during urination. This results from inflammation of the urethra. DIAGNOSIS  Trichomoniasis may be found during a Pap test or physical exam. Your health care provider may use one of the following methods to help diagnose this infection:  Examining vaginal discharge under a microscope. For men, urethral discharge would be examined.  Testing the pH of the vagina with a test tape.  Using a vaginal swab test that checks for the Trichomonas organism. A test is available that provides results within a few minutes.  Doing a culture test for the organism. This is not usually needed. TREATMENT   You may be given medicine to fight the infection. Women should inform their health care provider if they could be or are pregnant. Some medicines used to treat the infection should not be taken during pregnancy.  Your health care provider may recommend over-the-counter medicines or creams to decrease itching or irritation.  Your sexual partner will need to be treated if infected. HOME CARE INSTRUCTIONS   Take medicines only as directed by your health care provider.  Take  over-the-counter medicine for itching or irritation as directed by your health care provider.  Do not have sexual intercourse while you have the infection.  Women should not douche or wear tampons while they have the infection.  Discuss your infection with your partner. Your partner may have gotten the infection from you, or you may have gotten it from your partner.  Have your sex partner get examined and treated if necessary.  Practice safe, informed, and protected sex.  See your health care provider for other STI testing. SEEK MEDICAL CARE IF:   You still have symptoms after you finish your medicine.  You develop abdominal pain.  You have pain when you urinate.  You have bleeding after sexual intercourse.  You develop a rash.  Your medicine makes you sick or makes you throw up (vomit). MAKE SURE YOU:  Understand these instructions.  Will watch your condition.  Will get help right away if you are not doing well or get worse. Document Released: 11/20/2000 Document Revised: 10/11/2013 Document Reviewed: 03/08/2013 [facility] Patient Information 2015 [place], [place]. This information is not intended to replace advice given to you by your health care provider. Make sure you discuss any questions you have with your health care provider.  Take medication 1 g twice a day for one day

## 2014-02-10 NOTE — Assessment & Plan Note (Signed)
With her positive for trichomonas and positive with her 1 g of metronidazole twice a day for 2 doses., Enough medication was given to treat partner. Followup as needed
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
## 2014-02-15 ENCOUNTER — Telehealth: Payer: Self-pay | Admitting: Family Medicine

## 2014-02-15 NOTE — Telephone Encounter (Signed)
Please call Tully, her gonorrhea and chlamydia results are negative. If she has any continued symptoms from her Trichomonas infection she needs to be seen as soon as possible. Thanks.

## 2014-02-17 NOTE — Telephone Encounter (Signed)
Relayed message,patient voiced understanding. Katherine Woods  

## 2014-04-12 ENCOUNTER — Telehealth: Payer: Self-pay | Admitting: Family Medicine

## 2014-04-12 DIAGNOSIS — T8332XS Displacement of intrauterine contraceptive device, sequela: Secondary | ICD-10-CM

## 2014-04-12 NOTE — Telephone Encounter (Signed)
Wants to have IUD removed. Pt says it cannot be found so an ultrasound has to be done. Told her to contact [facility] since no ultrasound here. Pt says she was confused and wanted to talk to her dr

## 2014-04-13 NOTE — Telephone Encounter (Signed)
Red team, please call and find out what the question or confusion is about. Since we could not see her strings I will likely recommend she go to GYN to have the IUD removed.   Leeanne Rio, MD

## 2014-04-13 NOTE — Telephone Encounter (Signed)
Patient informed that [facility] will be calling her to set up appt.

## 2014-04-13 NOTE — Telephone Encounter (Signed)
Referral entered Kashaun Bebo J Tifini Reeder, MD  

## 2014-04-13 NOTE — Telephone Encounter (Signed)
Patient requesting referral to gyn to have IUD removed since strings could not be seen at office visit.

## 2014-04-21 ENCOUNTER — Ambulatory Visit: Payer: BC Managed Care – PPO

## 2014-04-25 NOTE — Telephone Encounter (Signed)
Has not been called by [facility] clinic to make an appt to have IUD removed

## 2014-04-26 ENCOUNTER — Telehealth: Payer: Self-pay

## 2014-04-26 ENCOUNTER — Telehealth: Payer: Self-pay | Admitting: Family Medicine

## 2014-04-26 ENCOUNTER — Telehealth: Payer: Self-pay | Admitting: *Deleted

## 2014-04-26 NOTE — Telephone Encounter (Signed)
Pt called and would like the doctor to fax over all her visits notes concerning her IUD to [facility] before her visit . jw

## 2014-04-26 NOTE — Telephone Encounter (Signed)
Pt called to office with questions regarding upcoming appt.  Pt states that she would like a full u/s if needed for IUD check to include looking at ovaries.  Pt request everything to be done in one visit as she does not have much money. Pt advised of need for initial visit in order to establish care, obtain history and preform exam.  Pt advised to discuss needs with physician but it is his determination of what is needed for her care. Pt states understanding.

## 2014-04-26 NOTE — Telephone Encounter (Signed)
let patient know about appt with [facility] on 11/19 at 10:45am

## 2014-04-26 NOTE — Telephone Encounter (Signed)
Womens clinic is going to be a long wait. Placed referral in [facility] workqueue. Pt informed. Tyja Gortney CMA

## 2014-04-28 ENCOUNTER — Ambulatory Visit (INDEPENDENT_AMBULATORY_CARE_PROVIDER_SITE_OTHER): Payer: BC Managed Care – PPO | Admitting: Obstetrics & Gynecology

## 2014-04-28 VITALS — BP 104/67 | HR 87 | Temp 98.2°F | Ht 61.5 in | Wt 117.0 lb

## 2014-04-28 DIAGNOSIS — Z30432 Encounter for removal of intrauterine contraceptive device: Secondary | ICD-10-CM

## 2014-04-28 DIAGNOSIS — T8332XA Displacement of intrauterine contraceptive device, initial encounter: Secondary | ICD-10-CM

## 2014-04-28 DIAGNOSIS — Z3202 Encounter for pregnancy test, result negative: Secondary | ICD-10-CM

## 2014-04-28 LAB — POCT URINE PREGNANCY: Preg Test, Ur: NEGATIVE

## 2014-04-28 MED ORDER — NORETHIN-ETH ESTRAD-FE BIPHAS 1 MG-10 MCG / 10 MCG PO TABS: 1.0000 | ORAL_TABLET | Freq: Every day | ORAL | Status: DC

## 2014-04-28 MED ORDER — DOXYCYCLINE HYCLATE 100 MG PO CAPS: 100.0000 mg | ORAL_CAPSULE | Freq: Two times a day (BID) | ORAL | Status: DC

## 2014-04-29 ENCOUNTER — Encounter: Payer: Self-pay | Admitting: Obstetrics & Gynecology
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
## 2014-05-12 ENCOUNTER — Encounter (HOSPITAL_COMMUNITY): Payer: Self-pay | Admitting: Emergency Medicine

## 2014-05-12 ENCOUNTER — Emergency Department (HOSPITAL_COMMUNITY)
Admission: EM | Admit: 2014-05-12 | Discharge: 2014-05-12 | Disposition: A | Discharge status: Home / Self Care | Payer: BC Managed Care – PPO | Attending: Emergency Medicine | Admitting: Emergency Medicine

## 2014-05-12 DIAGNOSIS — J0100 Acute maxillary sinusitis, unspecified: Secondary | ICD-10-CM | POA: Diagnosis not present

## 2014-05-12 DIAGNOSIS — R51 Headache: Secondary | ICD-10-CM | POA: Diagnosis present

## 2014-05-12 DIAGNOSIS — Z87828 Personal history of other (healed) physical injury and trauma: Secondary | ICD-10-CM | POA: Diagnosis not present

## 2014-05-12 DIAGNOSIS — J0110 Acute frontal sinusitis, unspecified: Secondary | ICD-10-CM | POA: Insufficient documentation

## 2014-05-12 DIAGNOSIS — Z9889 Other specified postprocedural states: Secondary | ICD-10-CM | POA: Insufficient documentation

## 2014-05-12 DIAGNOSIS — Z79899 Other long term (current) drug therapy: Secondary | ICD-10-CM | POA: Diagnosis not present

## 2014-05-12 MED ORDER — HYDROCODONE-ACETAMINOPHEN 5-325 MG PO TABS: 2.0000 | ORAL_TABLET | ORAL | Status: DC | PRN

## 2014-05-12 MED ORDER — AMOXICILLIN-POT CLAVULANATE 875-125 MG PO TABS
1.0000 | ORAL_TABLET | Freq: Two times a day (BID) | ORAL | Status: DC
Start: 2014-05-12 — End: 2014-12-19

## 2014-05-12 NOTE — Discharge Instructions (Signed)
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
## 2014-05-12 NOTE — ED Notes (Signed)
Pt c/o sinus headaches x 2 days. Pt sts she has a hx of sinus headaches and this is the same feeling. Pt A&Ox4. Pt denies blurry vision, double vision, dizziness, lightheadedness. A&Ox4. Pt ambulatory to triage.

## 2014-05-13 ENCOUNTER — Ambulatory Visit: Payer: BC Managed Care – PPO | Admitting: Family Medicine

## 2014-05-16 ENCOUNTER — Ambulatory Visit (INDEPENDENT_AMBULATORY_CARE_PROVIDER_SITE_OTHER): Payer: BC Managed Care – PPO | Admitting: Family Medicine

## 2014-05-16 ENCOUNTER — Encounter: Payer: Self-pay | Admitting: Family Medicine

## 2014-05-16 VITALS — BP 107/65 | HR 81 | Temp 98.9°F | Resp 20 | Wt 116.0 lb

## 2014-05-16 DIAGNOSIS — B029 Zoster without complications: Secondary | ICD-10-CM

## 2014-05-16 DIAGNOSIS — Z87820 Personal history of traumatic brain injury: Secondary | ICD-10-CM | POA: Insufficient documentation

## 2014-05-16 MED ORDER — VALACYCLOVIR HCL 1 G PO TABS: 1000.0000 mg | ORAL_TABLET | Freq: Three times a day (TID) | ORAL | Status: DC

## 2014-05-16 MED ORDER — OXYCODONE-ACETAMINOPHEN 5-325 MG PO TABS: 1.0000 | ORAL_TABLET | Freq: Three times a day (TID) | ORAL | Status: DC | PRN

## 2014-05-16 MED ORDER — ERYTHROMYCIN 5 MG/GM OP OINT: 1.0000 "application " | TOPICAL_OINTMENT | Freq: Four times a day (QID) | OPHTHALMIC | Status: DC

## 2014-05-16 NOTE — Assessment & Plan Note (Signed)
Obvious herpes zoster to L eyelid. Does not appear to have ocular involvement at this time given normal exam of eyeball itself, no photophobia, normal visual acuity. I am less concerned about her sinusitis and think her pain sx's at this time are related to the shingles of her eyelid. I have called and spokenw ith Dr. Posey Pronto, the ophthalmologist on call, who recommends treating overnight with erythromycin ointment and systemic antivirals. She will see the pt first thing in the morning. Instructed pt to be at ophtho office at [place] tomorrow for evaluation, and discussed warning signs/red flags which should prompt her to go to the ER overnight should this worsen. Also gave rx for percocet for pain control. Advised pt to continue augmentin for sinusitis.

## 2014-05-16 NOTE — Patient Instructions (Signed)
Go tomorrow morning at 8am to [facility] in [place]. Dr. Posey Pronto will be expecting you and will see you at that time.  In the mean time, take valtrex and use erythromycin ointment If your eye gets worse overnight or you have problems seeing please go immediately to the ER. I also am prescribing stronger pain medicine for you.  Dr. Ardelia Mems.
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
## 2014-05-17 ENCOUNTER — Telehealth: Payer: Self-pay | Admitting: Family Medicine

## 2014-05-17 NOTE — Telephone Encounter (Signed)
Discussed with patient. She saw ophthalmologist this morning, who told her she could return to work and reportedly also said the eye is okay and that she can f/u with her regular eye doctor.  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 Advised she must wash her hands frequently and avoid touching her eye and that I will write her a note saying she can return to work with light duty. Will leave note at front desk and patient will pick it up by 5pm today.  Leeanne Rio, MD

## 2014-05-17 NOTE — Telephone Encounter (Signed)
Pt would like a note for her work for light duty for a few days Please advise

## 2014-05-18 NOTE — Telephone Encounter (Signed)
Pt calling again to give more specific details, wants MD to know the type of cart they let her pick the first night she was back is ok, or she can pack ice or pack totes. Needs this asap so she can work and also said it needs to specify how long light duty is required.

## 2014-05-18 NOTE — Telephone Encounter (Signed)
Employer needs more specific detail about what types of jobs she can god. 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 Please advise

## 2014-05-19 NOTE — Telephone Encounter (Signed)
Called and spoke with patient. Updated work note to reflect what she needs it to say. Will leave at front desk for her to pick up later today.  Leeanne Rio, MD

## 2014-05-20 ENCOUNTER — Telehealth: Payer: Self-pay | Admitting: Family Medicine

## 2014-05-20 NOTE — Telephone Encounter (Signed)
Pt informed. Will call back and schedule an appt. Kalayna Noy Kennon Holter, CMA

## 2014-05-20 NOTE — Telephone Encounter (Signed)
Pt called and would like orders put in so that she can do a CT scan at the hospital so that they can see what's going on in her head. jw

## 2014-05-25 ENCOUNTER — Ambulatory Visit: Payer: BC Managed Care – PPO | Admitting: Family Medicine

## 2014-06-06 ENCOUNTER — Encounter: Payer: Self-pay | Admitting: *Deleted

## 2014-06-07 ENCOUNTER — Encounter: Payer: Self-pay | Admitting: Obstetrics & Gynecology

## 2014-06-07 ENCOUNTER — Ambulatory Visit: Payer: BC Managed Care – PPO | Admitting: Family Medicine

## 2014-06-17 ENCOUNTER — Ambulatory Visit: Payer: BC Managed Care – PPO | Admitting: Family Medicine

## 2014-07-05 ENCOUNTER — Encounter: Payer: Self-pay | Admitting: Family Medicine

## 2014-07-05 ENCOUNTER — Ambulatory Visit (INDEPENDENT_AMBULATORY_CARE_PROVIDER_SITE_OTHER): Payer: BLUE CROSS/BLUE SHIELD | Admitting: Family Medicine

## 2014-07-05 VITALS — BP 95/63 | HR 74 | Temp 98.1°F | Ht 61.5 in | Wt 118.3 lb

## 2014-07-05 DIAGNOSIS — Z309 Encounter for contraceptive management, unspecified: Secondary | ICD-10-CM | POA: Insufficient documentation

## 2014-07-05 DIAGNOSIS — Z30018 Encounter for initial prescription of other contraceptives: Secondary | ICD-10-CM

## 2014-07-05 MED ORDER — NORELGESTROMIN-ETH ESTRADIOL 150-35 MCG/24HR TD PTWK: 1.0000 | MEDICATED_PATCH | TRANSDERMAL | Status: DC

## 2014-07-05 NOTE — Assessment & Plan Note (Addendum)
Patient prefers to start with the patch. No contraindications to estrogen. Rx sent in. Discussed timing of starting on Sunday after her next period begins. Counseled on importance of backup contraception for one week after starting. Follow-up in 6 months for next physical.

## 2014-07-05 NOTE — Patient Instructions (Signed)
I sent in a prescription for your birth control. Start on the first day of your next period. One patch per week for 3 weeks, then no patch for 1 week.  Call with any problems.  Be well, Dr. Ardelia Mems

## 2014-07-05 NOTE — Progress Notes (Signed)
Patient ID: Katherine Woods, female   DOB: [birth year], 39 y.o.   MRN: [id number]  HPI:  Birth control: Back in the fall patient underwent removal of her IUD by OB/GYN. She was started on a combined OCP. She stopped this after about 3 weeks because she started having tingling in her leg and attributed this to the OCP. She wants to start something different. She denies any history of blood clots, or history of migraine with aura. She prefers not to take a pill. She's done the patch in the past and did well with this. She is not interested in nexplanon or another IUD. Gets periods monthly. Last menstrual cycle started Wednesday of last week. Has not had sex in 1-2 months. Denies pelvic pain, vaginal discharge, or intermenstrual bleeding. Interested in starting the patch.  ROS: See HPI.  [place]: History of TBI, trichomonas, shingles (recent flare which resolved)  PHYSICAL EXAM: BP 95/63 mmHg  Pulse 74  Temp(Src) 98.1 F (36.7 C) (Oral)  Ht 5' 1.5" (1.562 m)  Wt 118 lb 4.8 oz (53.661 kg)  BMI 21.99 kg/m2  LMP 06/29/2014 Gen: NAD, pleasant, cooperative HEENT: NCAT Lungs: NWOB Neuro: grossly nonfocal speech normal  ASSESSMENT/PLAN:  Contraception management Patient prefers to start with the patch. No contraindications to estrogen. Rx sent in. Discussed timing of starting on Sunday after her next period begins. Counseled on importance of backup contraception for one week after starting. Follow-up in 6 months for next physical.    FOLLOW UP: F/u in 6 months for physical  [place] J. Ardelia Mems, [place]

## 2014-08-08 ENCOUNTER — Telehealth: Payer: Self-pay | Admitting: Family Medicine

## 2014-08-08 NOTE — Telephone Encounter (Signed)
Has questions about the patch

## 2014-08-10 NOTE — Telephone Encounter (Signed)
She wants to know why she is having a heavy cycle when starting the patch. (only been on the patch 2 weeks). Thought she had her cycle 2 weeks prior. What should she do? Chandra Asher Kennon Holter, CMA

## 2014-08-11 NOTE — Telephone Encounter (Signed)
It may take a while for her periods to regulate with a new form of birth control. I recommend she give it time, unless she is very concerned about her bleeding, in which case she should come in for an appointment. Please inform patient.  Leeanne Rio, MD

## 2014-08-15 NOTE — Telephone Encounter (Signed)
Pt informed. Lynard Postlewait, CMA  

## 2014-09-15 ENCOUNTER — Telehealth: Payer: Self-pay | Admitting: Family Medicine

## 2014-09-15 NOTE — Telephone Encounter (Signed)
Her current medication is the same patch as what is typed below. Please inform patient. If she does not like the patch and wants to switch to a different form of birth control she should come in for an appointment.  Thanks, Leeanne Rio, MD

## 2014-09-15 NOTE — Telephone Encounter (Signed)
Pt called and said that she is not happy with her correct BC patch and would like to get the patch she used a few years ago norelgestromin-estradiol ethinyl, Can we call this instead jw

## 2014-09-19 NOTE — Telephone Encounter (Signed)
LM for patient to call back. Katherine Woods,CMA  

## 2014-09-20 ENCOUNTER — Other Ambulatory Visit: Payer: Self-pay | Admitting: *Deleted

## 2014-09-20 NOTE — Telephone Encounter (Signed)
Pt is requesting a 90 day supply of the birth control patch.  Derl Barrow, RN

## 2014-09-21 MED ORDER — NORELGESTROMIN-ETH ESTRADIOL 150-35 MCG/24HR TD PTWK: 1.0000 | MEDICATED_PATCH | TRANSDERMAL | Status: DC

## 2014-09-21 NOTE — Telephone Encounter (Signed)
Refill sent to pharmacy.   

## 2014-12-19 ENCOUNTER — Encounter: Payer: Self-pay | Admitting: Family Medicine

## 2014-12-19 ENCOUNTER — Ambulatory Visit (INDEPENDENT_AMBULATORY_CARE_PROVIDER_SITE_OTHER): Payer: BLUE CROSS/BLUE SHIELD | Admitting: Family Medicine

## 2014-12-19 VITALS — BP 94/50 | HR 82 | Temp 97.9°F | Wt 116.0 lb

## 2014-12-19 DIAGNOSIS — Z7251 High risk heterosexual behavior: Secondary | ICD-10-CM

## 2014-12-19 DIAGNOSIS — Z113 Encounter for screening for infections with a predominantly sexual mode of transmission: Secondary | ICD-10-CM

## 2014-12-19 DIAGNOSIS — Z30011 Encounter for initial prescription of contraceptive pills: Secondary | ICD-10-CM

## 2014-12-19 LAB — POCT URINE PREGNANCY: PREG TEST UR: NEGATIVE

## 2014-12-19 MED ORDER — NORETHIN ACE-ETH ESTRAD-FE 1-20 MG-MCG(24) PO TABS: 1.0000 | ORAL_TABLET | Freq: Every day | ORAL | Status: DC

## 2014-12-19 NOTE — Progress Notes (Signed)
Patient ID: Katherine Woods, female   DOB: 09/03/1975, 39 y.o.   MRN: [id number]  HPI:  Birth control: Currently sexually active with one female partner in the last year. Previously was using Ortho Evra patch, but states she does not want this as she was bleeding in between periods while on it. She is instead interested in switching to a combined oral contraception pill. She stopped the patch 2 weeks ago. Her last period was about 2 weeks ago as well. She recently resumed sexual activity after a period of abstinence. Denies any history of blood clots or history of migraine with aura. She does not smoke currently. She would also like testing for HIV today. Declines pelvic exam for gonorrhea and chlamydia. She is concerned about possible pregnancy and requests serum pregnancy test today.  ROS: See HPI.  [place]: History of traumatic brain injury with subdural hematoma  PHYSICAL EXAM: BP 94/50 mmHg  Pulse 82  Temp(Src) 97.9 F (36.6 C) (Oral)  Wt 116 lb (52.617 kg)  LMP 12/05/2014 Gen: No acute distress, pleasant, cooperative HEENT: Normocephalic, atraumatic Lungs: Clear to auscultation bilaterally, normal respiratory effort Neuro: Grossly nonfocal, speech normal Ext: Gait normal  ASSESSMENT/PLAN:  Contraception management Discussed various options with patient today. She would prefer a low estrogen-containing OCP. She previously did not like the 10 mg estrogen pill, so we will try her on the 20mg  pill (Loestrin 24 Fe). Given year's supply but instructed patient not to start this until the first day of her next period, and until we confirm that she does not have any hCG in her blood. I will call her with her test results.  Screening for STD (sexually transmitted disease) Patient would like HIV testing today. Convinced her to also do syphilis and hepatitis testing. She declines a pelvic exam for gonorrhea or chlamydia. States she has no pelvic symptoms. We'll draw labs and call her with results  when available. Counseled on importance of using condoms.   FOLLOW UP: Follow up as needed  [place] J. Ardelia Mems, [place]

## 2014-12-19 NOTE — Patient Instructions (Addendum)
I sent in new birth control for you. Low estrogen dose. Wait until first day of next period to start. Checking labs today - will call with results  Be well, Dr. Ardelia Mems

## 2014-12-19 NOTE — Assessment & Plan Note (Signed)
Discussed various options with patient today. She would prefer a low estrogen-containing OCP. She previously did not like the 10 mg estrogen pill, so we will try her on the 20mg  pill (Loestrin 24 Fe). Given year's supply but instructed patient not to start this until the first day of her next period, and until we confirm that she does not have any hCG in her blood. I will call her with her test results.

## 2014-12-19 NOTE — Assessment & Plan Note (Signed)
Patient would like HIV testing today. Convinced her to also do syphilis and hepatitis testing. She declines a pelvic exam for gonorrhea or chlamydia. States she has no pelvic symptoms. We'll draw labs and call her with results when available. Counseled on importance of using condoms.

## 2014-12-20 LAB — HCG, QUANTITATIVE, PREGNANCY

## 2014-12-20 LAB — HEPATITIS PANEL, ACUTE
HCV AB: NEGATIVE
HEP B S AG: NEGATIVE
Hep A IgM: NONREACTIVE
Hep B C IgM: NONREACTIVE

## 2014-12-20 LAB — RPR

## 2014-12-20 LAB — HIV ANTIBODY (ROUTINE TESTING W REFLEX): HIV 1&2 Ab, 4th Generation: NONREACTIVE

## 2014-12-23 ENCOUNTER — Ambulatory Visit: Payer: BLUE CROSS/BLUE SHIELD | Admitting: Family Medicine

## 2015-01-24 ENCOUNTER — Ambulatory Visit (INDEPENDENT_AMBULATORY_CARE_PROVIDER_SITE_OTHER): Payer: BLUE CROSS/BLUE SHIELD | Admitting: Family Medicine

## 2015-01-24 ENCOUNTER — Encounter: Payer: Self-pay | Admitting: Family Medicine

## 2015-01-24 VITALS — BP 101/50 | HR 87 | Temp 98.4°F | Wt 123.0 lb

## 2015-01-24 DIAGNOSIS — J309 Allergic rhinitis, unspecified: Secondary | ICD-10-CM | POA: Diagnosis not present

## 2015-01-24 DIAGNOSIS — H1013 Acute atopic conjunctivitis, bilateral: Secondary | ICD-10-CM | POA: Diagnosis not present

## 2015-01-24 MED ORDER — CETIRIZINE HCL 10 MG PO TABS: 10.0000 mg | ORAL_TABLET | Freq: Every day | ORAL | Status: DC

## 2015-01-24 MED ORDER — OLOPATADINE HCL 0.2 % OP SOLN: OPHTHALMIC | Status: DC

## 2015-01-24 NOTE — Assessment & Plan Note (Addendum)
Consistent with chronic allergies, bilateral conjunctivitis and rhinitis. Seems to be year round, patient unclear on which seasons worse. No evidence of acute infection. - Inadequate medical therapy at home, irregular use of cetirizine  Plan: 1. Start Olopatadine 1 drop both eyes daily for up to 3 months, trial off, find out which season is worse, otherwise can use year-round 2. Start Cetirizine 10mg  daily for trial use up to 3 months, again trial find worse season 3. Can try OTC Nasal saline 4. RTC PRN

## 2015-01-24 NOTE — Patient Instructions (Signed)
Dear Katherine Woods, Thank you for coming in to clinic today.  1. Take the prescribed anti-allergy drops, one in each eye daily for next few months, then may stop if allergy season over 2. Take Zyrtec once daily for next few months as well  Avoid scratching or rubbing eyes  Please schedule a follow-up appointment with Dr. Ardelia Mems in 3 months as needed  If you have any other questions or concerns, please feel free to call the clinic to contact me. You may also schedule an earlier appointment if necessary.  However, if your symptoms get significantly worse, please go to the Emergency Department to seek immediate medical attention.  [name], [place]
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
## 2015-01-30 ENCOUNTER — Encounter: Payer: Self-pay | Admitting: Family Medicine

## 2015-01-30 ENCOUNTER — Telehealth: Payer: Self-pay | Admitting: Family Medicine

## 2015-01-30 NOTE — Telephone Encounter (Signed)
Pt was last seen by Dr. Parks Ranger and states that medication prescribed is not helping with her eyes itching when she is working in [employer]. Would like to know if there's something stronger that she may try. Sadie Reynolds, ASA

## 2015-01-30 NOTE — Telephone Encounter (Addendum)
Last OV 01/24/15 for allergic conjunctivitis and rhinitis, rx Cetirizine 10mg  daily and Olopatadine eye drops trial x 3 months. 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 Requesting stronger medicine. I told her that there are not any available stronger medicines. Recommend continue eye drops 1 drop in each eye daily for 3 months and would start wearing eye protection at work such as safety goggles or glasses that reduce potential allergens from getting in her eyes. Additionally, can do warm compresses as needed. She stated Cetirizine was not covered by ins, however she will proceed with OTC Cetirizine at this time daily.  She requested a letter written stating she was seen in clinic on 01/24/15 and dx with allergies and the treatment. Not requesting any time off or any work conditions. I wrote this letter, it is available for pick-up in [employer]. If needs to be changed in anyway, this will need to be done by a preceptor in clinic, as I am not available the rest of this week at [facility].  [name], [place], PGY-3

## 2015-02-02 ENCOUNTER — Telehealth: Payer: Self-pay | Admitting: Family Medicine

## 2015-02-02 NOTE — Telephone Encounter (Signed)
Pt wants Dr. Parks Ranger to know that the allergy med that was prescribed to her is not working and would like to know what the next steps would be to take. Katherine Woods, ASA

## 2015-02-06 ENCOUNTER — Telehealth: Payer: Self-pay | Admitting: Family Medicine

## 2015-02-06 NOTE — Telephone Encounter (Signed)
Would like to talk to Dr Ardelia Mems. Eyes are bothering here.  Was given drops for her allergies.  Her drops are out and pharmacy will not refill them because insurance wont cover them Her eyes are hurting and are affecting her job

## 2015-02-07 ENCOUNTER — Encounter: Payer: Self-pay | Admitting: Family Medicine

## 2015-02-07 MED ORDER — LORATADINE 10 MG PO TABS: 10.0000 mg | ORAL_TABLET | Freq: Every day | ORAL | Status: DC

## 2015-02-07 MED ORDER — CROMOLYN SODIUM 4 % OP SOLN: 1.0000 [drp] | Freq: Four times a day (QID) | OPHTHALMIC | Status: DC

## 2015-02-07 NOTE — Telephone Encounter (Signed)
Pt called back, she goes to work at 57.  She needs to know if she will get the excuse by then Please advise

## 2015-02-07 NOTE — Telephone Encounter (Signed)
Pt came in person to the clinic and waited until I was done seeing patients so I could speak with her. She reports continued itching of eyes, not relieved by patanol drops. Also states zyrtec isn't helping. She's gotten Allegra over the counter. Being at work exacerbates the itching of her eyes. Denies vision changes. Requesting alternate medications.  Sent in rx for claritin and cromolyn eye drops for patient. Scheduled visit with me this Friday at 9:30am for full evaluation. Given work note excuse through Friday.  Leeanne Rio, MD

## 2015-02-07 NOTE — Telephone Encounter (Signed)
Would like drs excuse to be out of work [employer] and [employer] this week because of her eye irritation She needs something stronger for her eye allergies

## 2015-02-07 NOTE — Telephone Encounter (Signed)
Pt was checking the status of when the doctor will be calling her. jw

## 2015-02-07 NOTE — Telephone Encounter (Signed)
Patient seen by Dr. Parks Ranger on 8/17 for allergies and eyes irritation. States the drops he prescribed have not been working and her eyes have continued to be watery, itchy, and swollen (seems to be worse when she is at work, thinks it may be due to allergen in workplace). Patient asking if something stronger can be called in for allergies and if MD can write her a couple days out of work to allow new medication to take effect. Will forward to PCP and Dr. Parks Ranger.

## 2015-02-08 NOTE — Telephone Encounter (Signed)
Already addressed in later telephone note per Dr. Ardelia Mems.  [name], [place], PGY-3

## 2015-02-10 ENCOUNTER — Ambulatory Visit (INDEPENDENT_AMBULATORY_CARE_PROVIDER_SITE_OTHER): Payer: BLUE CROSS/BLUE SHIELD | Admitting: Family Medicine

## 2015-02-10 ENCOUNTER — Encounter: Payer: Self-pay | Admitting: Family Medicine

## 2015-02-10 VITALS — BP 94/61 | HR 82 | Temp 97.8°F | Ht 62.0 in | Wt 120.0 lb

## 2015-02-10 DIAGNOSIS — J309 Allergic rhinitis, unspecified: Secondary | ICD-10-CM | POA: Diagnosis not present

## 2015-02-10 DIAGNOSIS — H1013 Acute atopic conjunctivitis, bilateral: Secondary | ICD-10-CM | POA: Diagnosis not present

## 2015-02-10 DIAGNOSIS — H6121 Impacted cerumen, right ear: Secondary | ICD-10-CM

## 2015-02-10 DIAGNOSIS — S00521A Blister (nonthermal) of lip, initial encounter: Secondary | ICD-10-CM | POA: Diagnosis not present

## 2015-02-10 DIAGNOSIS — H6120 Impacted cerumen, unspecified ear: Secondary | ICD-10-CM | POA: Insufficient documentation

## 2015-02-10 NOTE — Assessment & Plan Note (Signed)
Complete impaction of R canal from cerumen. Attempted manual removal of cerumen with curette today and was able to remove some, but wax is too hard to remove all of it. Recommend OTC debrox drops. F/u as needed.

## 2015-02-10 NOTE — Patient Instructions (Addendum)
For eyes: - Continue claritin and cromolyn eye drops  For mouth: - lets give it some time and see if the color on your lips gets better  For ears: - Get over the counter Debrox drops for your ear wax  Follow up with me in 1 month  Be well, Dr. Ardelia Mems

## 2015-02-10 NOTE — Assessment & Plan Note (Signed)
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 No blisters present today, so unable to swab for HSV testing. Mild hyperpigmentation present, suspect this will improve with time as lesions fully heal. F/u as needed.
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
## 2015-02-10 NOTE — Assessment & Plan Note (Addendum)
Improved on claritin and cromolyn drops Continue these medications. F/u in 1 mo to continue to assess response

## 2015-03-09 ENCOUNTER — Ambulatory Visit: Payer: BLUE CROSS/BLUE SHIELD | Admitting: Family Medicine

## 2015-04-11 ENCOUNTER — Ambulatory Visit: Payer: BLUE CROSS/BLUE SHIELD | Admitting: Family Medicine

## 2015-04-12 ENCOUNTER — Ambulatory Visit: Payer: BLUE CROSS/BLUE SHIELD | Admitting: Family Medicine

## 2015-04-20 ENCOUNTER — Ambulatory Visit: Payer: BLUE CROSS/BLUE SHIELD | Admitting: Family Medicine

## 2015-04-21 ENCOUNTER — Encounter: Payer: Self-pay | Admitting: Family Medicine

## 2015-04-21 ENCOUNTER — Ambulatory Visit (INDEPENDENT_AMBULATORY_CARE_PROVIDER_SITE_OTHER): Payer: BLUE CROSS/BLUE SHIELD | Admitting: Family Medicine

## 2015-04-21 VITALS — BP 97/62 | HR 80 | Temp 97.8°F | Ht 62.0 in | Wt 120.0 lb

## 2015-04-21 DIAGNOSIS — N83209 Unspecified ovarian cyst, unspecified side: Secondary | ICD-10-CM

## 2015-04-21 DIAGNOSIS — Z3040 Encounter for surveillance of contraceptives, unspecified: Secondary | ICD-10-CM | POA: Diagnosis not present

## 2015-04-21 DIAGNOSIS — N83202 Unspecified ovarian cyst, left side: Secondary | ICD-10-CM | POA: Diagnosis not present

## 2015-04-21 LAB — POCT URINE PREGNANCY: Preg Test, Ur: NEGATIVE
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
## 2015-04-23 NOTE — Assessment & Plan Note (Signed)
Follow up ultrasound ordered Schedule IUD placement after u/s resulted.

## 2015-04-28 ENCOUNTER — Telehealth: Payer: Self-pay | Admitting: Family Medicine

## 2015-04-28 ENCOUNTER — Ambulatory Visit (HOSPITAL_COMMUNITY)
Admission: RE | Admit: 2015-04-28 | Discharge: 2015-04-28 | Disposition: A | Discharge status: Home / Self Care | Payer: BLUE CROSS/BLUE SHIELD | Source: Ambulatory Visit | Attending: Family Medicine | Admitting: Family Medicine

## 2015-04-28 DIAGNOSIS — D251 Intramural leiomyoma of uterus: Secondary | ICD-10-CM | POA: Insufficient documentation

## 2015-04-28 DIAGNOSIS — N83202 Unspecified ovarian cyst, left side: Secondary | ICD-10-CM | POA: Insufficient documentation

## 2015-04-28 NOTE — Telephone Encounter (Signed)
Attempted to reach patient to discuss ultrasound results. Left VM asking her to return the call. I will need to speak with her directly about her results.  Leeanne Rio, MD

## 2015-05-02 ENCOUNTER — Telehealth: Payer: Self-pay | Admitting: Family Medicine

## 2015-05-02 ENCOUNTER — Ambulatory Visit: Payer: BLUE CROSS/BLUE SHIELD | Admitting: Family Medicine

## 2015-05-02 DIAGNOSIS — N9489 Other specified conditions associated with female genital organs and menstrual cycle: Secondary | ICD-10-CM

## 2015-05-02 NOTE — Telephone Encounter (Signed)
Patient asking to speak with Dr. Ardelia Mems about possibly getting an MRI, but she wants to speak with doctor first. Also says she does not want to go to a gynecologist afterall.

## 2015-05-03 NOTE — Telephone Encounter (Signed)
Spoke with patient, she states she wants to speak directly to Dr. Ardelia Mems about the possibilty of getting an MRI first before seeing a gynecologist.

## 2015-05-03 NOTE — Telephone Encounter (Signed)
Pt calling to check status of request. Thank you, Fonda Kinder, ASA

## 2015-05-03 NOTE — Telephone Encounter (Signed)
Spoke with patient - see separate phone note for details Katherine Rio, MD

## 2015-05-03 NOTE — Telephone Encounter (Signed)
Called and spoke with patient. When I talked to her on Monday (late entry note about that conversation) we discussed either proceeding with MRI of pelvis or referral to GYN to discuss further. She at that time had wanted GYN referral. She's thought about it more and now wants to just go ahead and get the MRI to look at the bilateral adnexal masses seen on ultrasound. Will order MRI. Red team/Tia, please schedule & call her with appointment time.  Thanks, Leeanne Rio, MD

## 2015-05-08 NOTE — Telephone Encounter (Signed)
Left message on patient voicemail informing of MRI appt for 05/22/15 at 4:45pm at [facility] (radiology)

## 2015-05-15 ENCOUNTER — Telehealth: Payer: Self-pay | Admitting: Family Medicine

## 2015-05-15 NOTE — Telephone Encounter (Signed)
Pt is calling back because she wanted to know what code the doctor was going to use when ordering her [place]. The reason is she wants to know how much her insurance will cover. jw

## 2015-05-15 NOTE — Telephone Encounter (Signed)
Pt called and wanted to speak to the doctor and also have the pictures from her [place] printed out for her. jw

## 2015-05-22 ENCOUNTER — Ambulatory Visit (HOSPITAL_COMMUNITY): Payer: BLUE CROSS/BLUE SHIELD

## 2015-05-30 NOTE — Telephone Encounter (Signed)
Returned call to patient - we will await her MRI prior to deciding about future ultrasounds. Money is tight for her and may be a barrier. MRI should give [place] good imaging.  Leeanne Rio, MD

## 2015-06-02 ENCOUNTER — Ambulatory Visit (HOSPITAL_COMMUNITY)
Admission: RE | Admit: 2015-06-02 | Discharge: 2015-06-02 | Disposition: A | Discharge status: Home / Self Care | Payer: BLUE CROSS/BLUE SHIELD | Source: Ambulatory Visit | Attending: Family Medicine | Admitting: Family Medicine

## 2015-06-02 DIAGNOSIS — N949 Unspecified condition associated with female genital organs and menstrual cycle: Secondary | ICD-10-CM | POA: Diagnosis not present

## 2015-06-02 DIAGNOSIS — D251 Intramural leiomyoma of uterus: Secondary | ICD-10-CM | POA: Insufficient documentation

## 2015-06-02 DIAGNOSIS — N83202 Unspecified ovarian cyst, left side: Secondary | ICD-10-CM | POA: Diagnosis not present

## 2015-06-02 DIAGNOSIS — N9489 Other specified conditions associated with female genital organs and menstrual cycle: Secondary | ICD-10-CM

## 2015-06-02 DIAGNOSIS — D259 Leiomyoma of uterus, unspecified: Secondary | ICD-10-CM | POA: Insufficient documentation

## 2015-06-02 MED ORDER — GADOBENATE DIMEGLUMINE 529 MG/ML IV SOLN
10.0000 mL | Freq: Once | INTRAVENOUS | Status: AC | PRN
  Administered 2015-06-02: 10 mL via INTRAVENOUS

## 2015-06-03 IMAGING — US US TRANSVAGINAL NON-OB
1 series · 13 of 25 positions shown · non-contrast
Comparison: 12/16/2011

CLINICAL DATA: Malpositioned IUD.

EXAM:
TRANSABDOMINAL AND TRANSVAGINAL ULTRASOUND OF PELVIS
TECHNIQUE: Both transabdominal and transvaginal ultrasound examinations of the
pelvis were performed. Transabdominal technique was performed for
global imaging of the pelvis including uterus, ovaries, adnexal
regions, and pelvic cul-de-sac. It was necessary to proceed with
endovaginal exam following the transabdominal exam to visualize the
uterus, endometrium, ovaries and adnexa .

[Series 1: us transvaginal non-ob · 0.18mm/px · 13 of 71 slices shown]
[im 1/71]
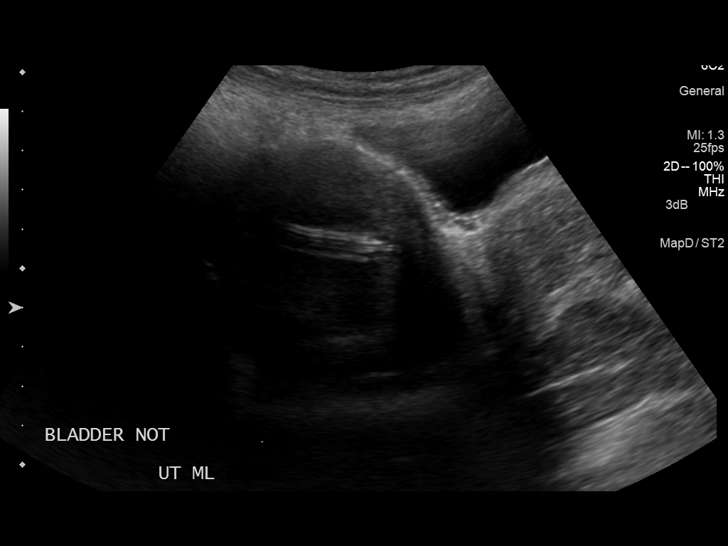
[im 6/71]
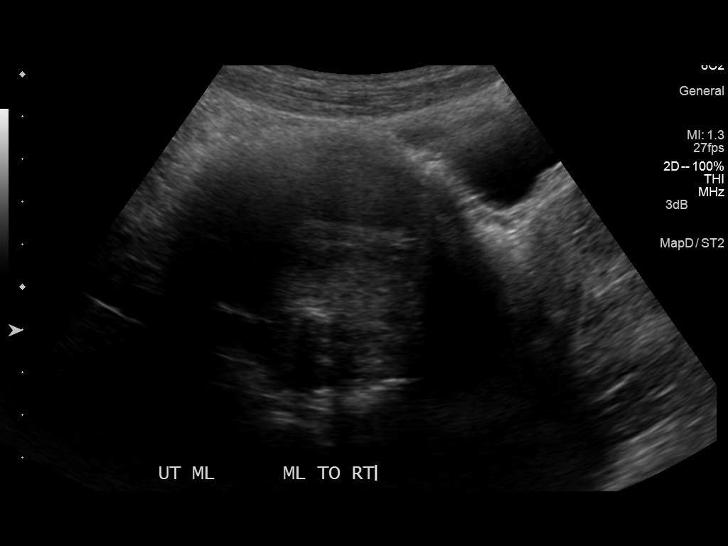
[im 12/71]
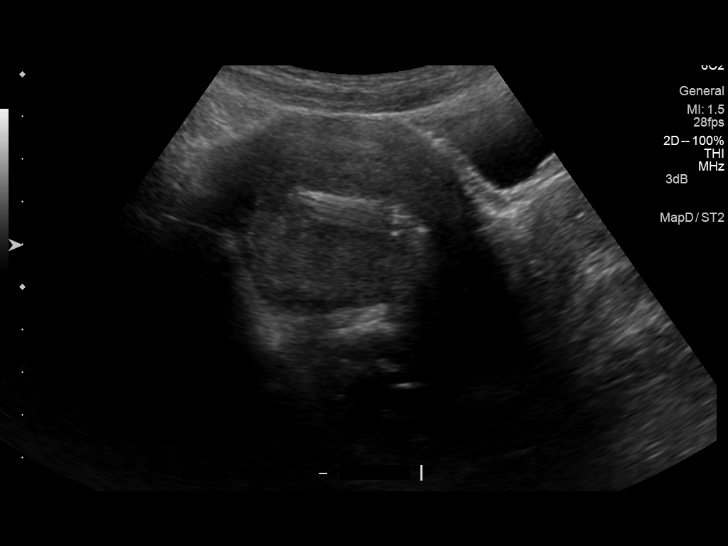
[im 18/71]
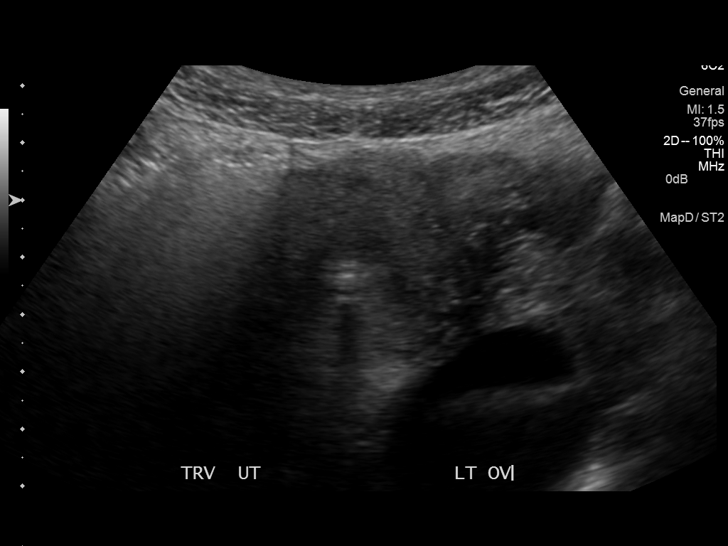
[im 24/71]
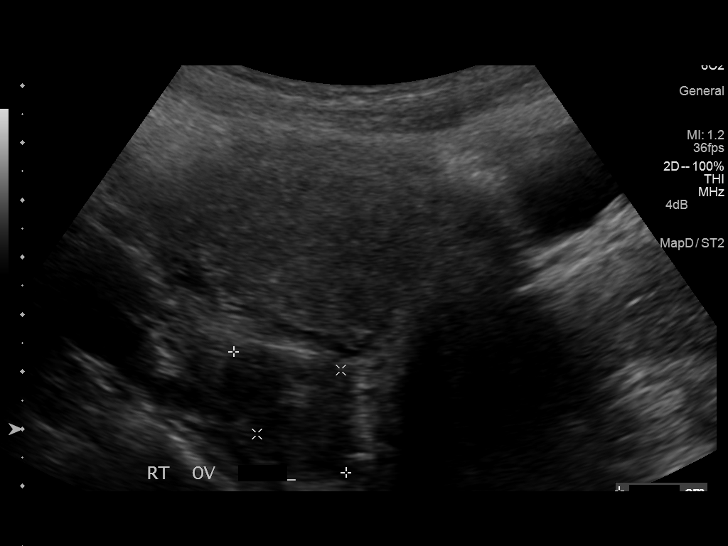
[im 30/71]
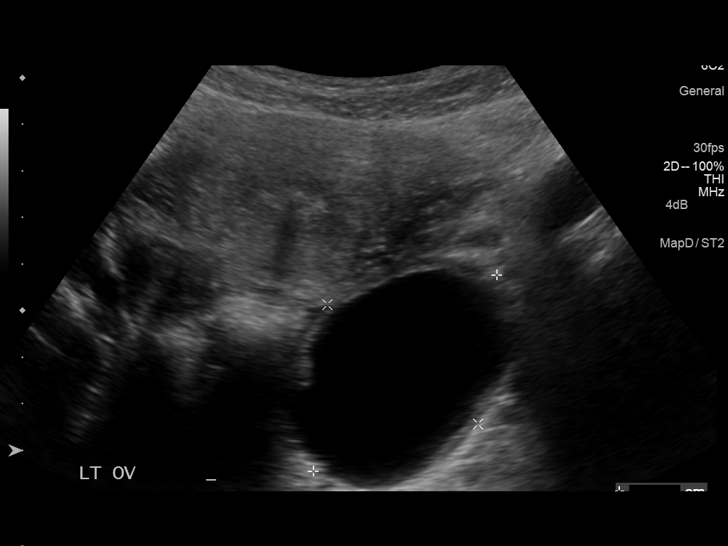
[im 36/71]
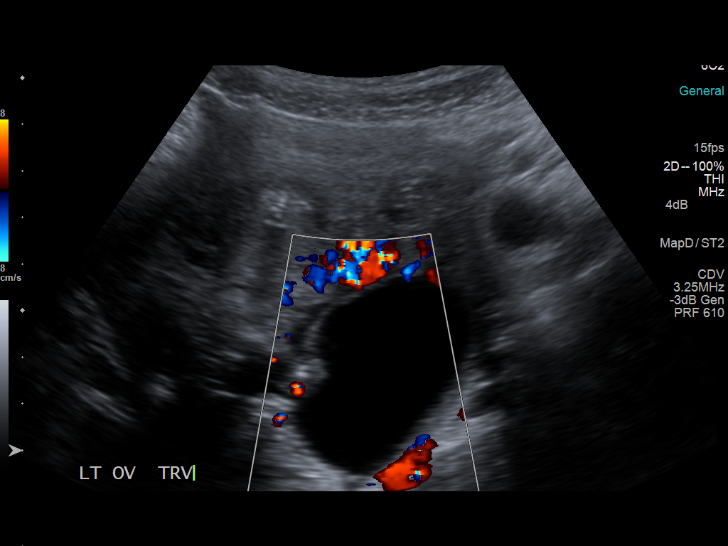
[im 41/71]
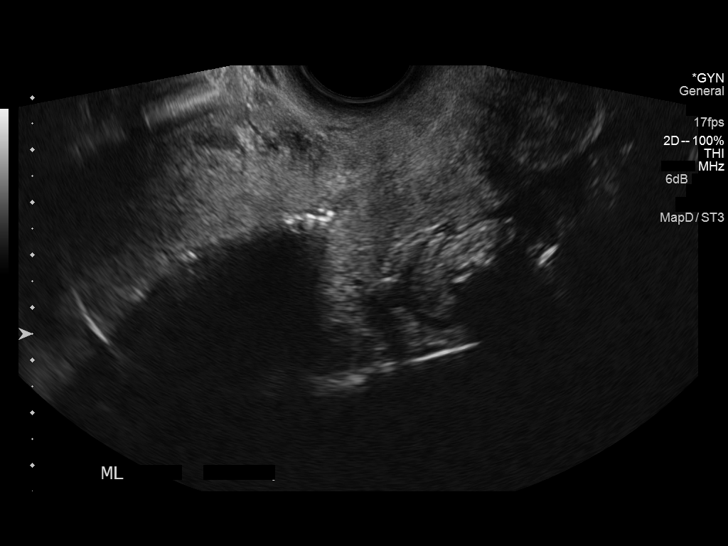
[im 47/71]
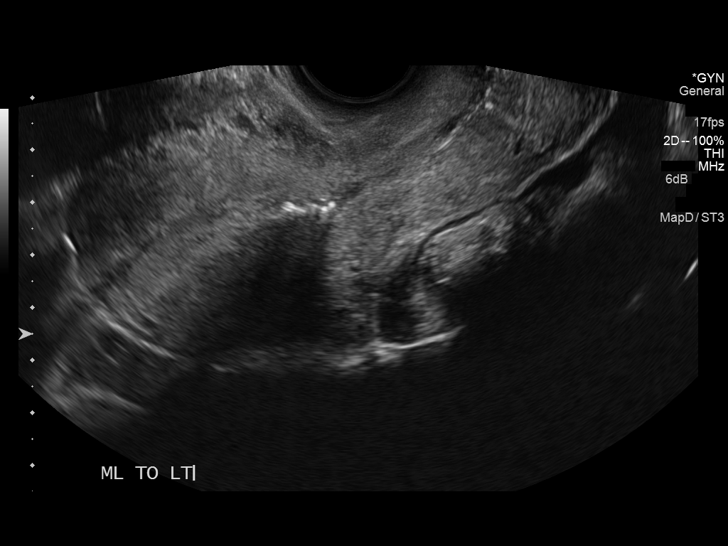
[im 53/71]
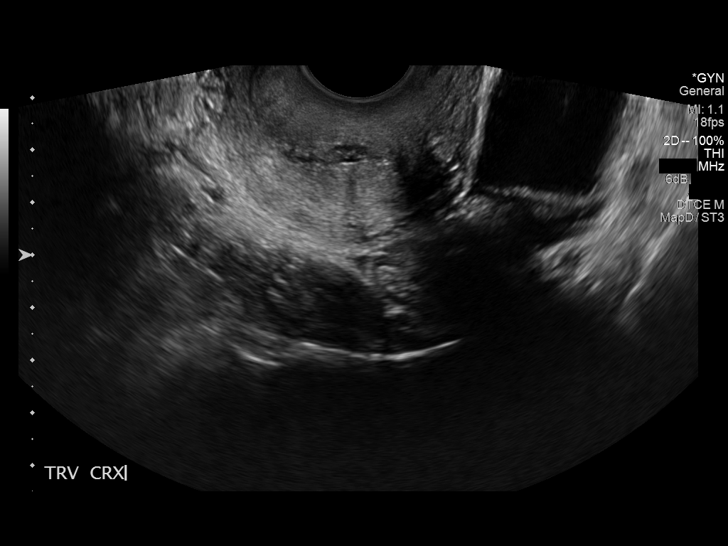
[im 59/71]
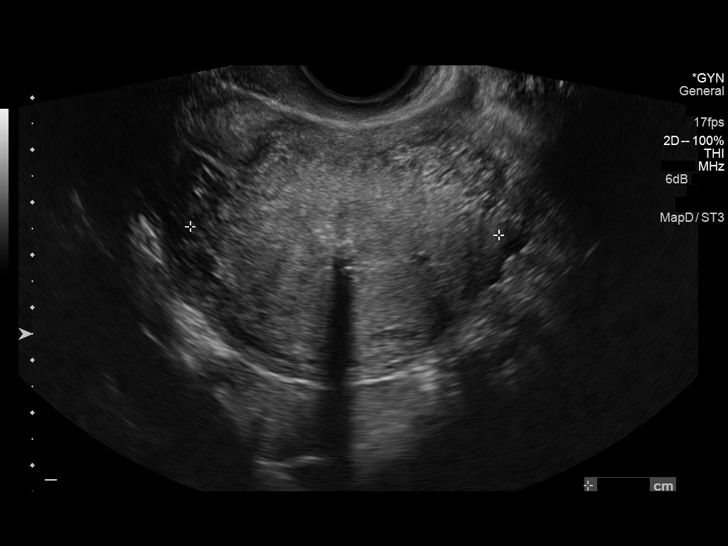
[im 65/71]
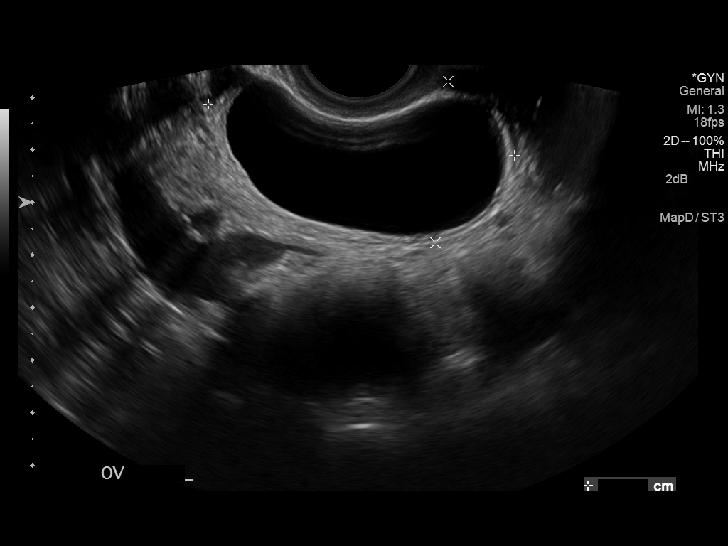
[im 71/71]
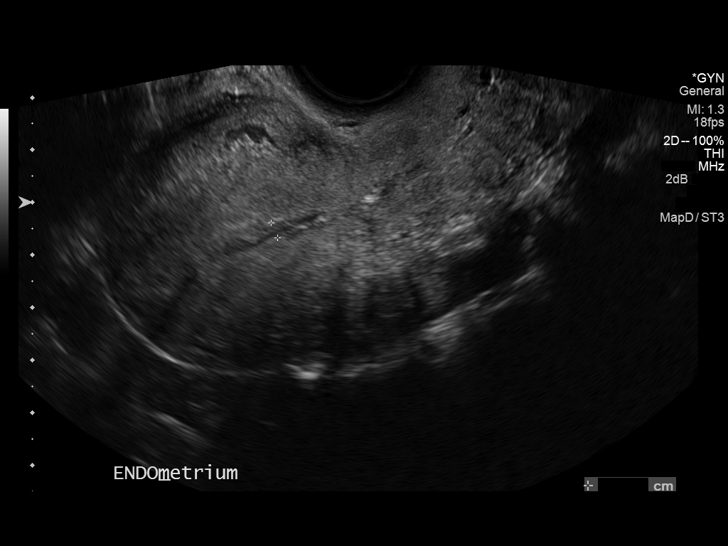

[13 of 25 positions shown; findings below may reference images not displayed]

FINDINGS: Uterus

Measurements: 8.8 x 5.3 x 4.8 cm. No fibroids or other mass
visualized.

Endometrium

Thickness: 3 mm in thickness. IUD located in expected position
within the endometrial cavity. No focal abnormality visualized.

Right ovary

Measurements: 2.9 x 1.8 x 2.0 cm. Normal appearance/no adnexal mass.

Left ovary

Measurements: 5.8 x 4.1 x 4.6 cm simple appearing cysts within the
left ovary measures 5.1 x 3.7 x 3.9 cm. No mural nodularity,
septations or internal blood flow..

Other findings

No free fluid.
IMPRESSION: IUD in expected position within the endometrium.

5.1 cm simple appearing left ovarian cyst. This is almost certainly
benign, but follow up ultrasound is recommended in 1 year according
to the Society of Radiologists in AltrasoundSEVE Consensus
Conference Statement (Paulus N Ceejay et al. Management of Asymptomatic
Ovarian and Other Adnexal Cysts Imaged at US: Society of
Radiologists in Ultrasound Consensus Conference Statement 6353.
Radiology [DATE]): [id number].).

## 2015-06-06 ENCOUNTER — Telehealth: Payer: Self-pay | Admitting: Family Medicine

## 2015-06-06 NOTE — Telephone Encounter (Signed)
Spoke with her.  No signs of cancer.  She should follow up with Dr Ardelia Mems as previously planned

## 2015-06-06 NOTE — Telephone Encounter (Signed)
Pt calling to get results from Friday. Katherine Woods, ASA

## 2015-06-26 ENCOUNTER — Ambulatory Visit: Payer: BLUE CROSS/BLUE SHIELD | Admitting: Family Medicine

## 2015-06-27 ENCOUNTER — Ambulatory Visit (INDEPENDENT_AMBULATORY_CARE_PROVIDER_SITE_OTHER): Payer: BLUE CROSS/BLUE SHIELD | Admitting: Family Medicine

## 2015-06-27 ENCOUNTER — Encounter: Payer: Self-pay | Admitting: Family Medicine

## 2015-06-27 VITALS — BP 116/54 | HR 89 | Temp 98.3°F | Ht 62.0 in | Wt 120.3 lb

## 2015-06-27 DIAGNOSIS — Z87820 Personal history of traumatic brain injury: Secondary | ICD-10-CM

## 2015-06-27 DIAGNOSIS — Z0271 Encounter for disability determination: Secondary | ICD-10-CM | POA: Diagnosis not present

## 2015-06-27 NOTE — Patient Instructions (Signed)
Referring you to physical/occupational therapy for an evaluation. Follow up with me after that so we can discuss any necessary paperwork  Be well, Dr. Ardelia Mems

## 2015-06-30 NOTE — Assessment & Plan Note (Signed)
Will refer to physical therapy for FCE/work evaluation. Patient instructed to follow up with me in clinic after that so we can complete any necessary paperwork.
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
## 2015-07-06 ENCOUNTER — Ambulatory Visit: Payer: BLUE CROSS/BLUE SHIELD | Attending: Family Medicine

## 2015-07-06 ENCOUNTER — Telehealth: Payer: Self-pay | Admitting: Family Medicine

## 2015-07-06 DIAGNOSIS — R6889 Other general symptoms and signs: Secondary | ICD-10-CM

## 2015-07-06 NOTE — Telephone Encounter (Signed)
Pt called and would like to speak to the doctor about the referral to the PT. She said that she doesn't think that this was beneficial at all and not a good match for what her problems are. Please call patient to discuss what is the next step. jw
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
## 2015-07-07 NOTE — Telephone Encounter (Signed)
Late entry - called patient back yesterday to discuss her concerns. Advised I need to see the physical therapy evaluation report first, and she will need to either come in for an office visit with the packet of papers she needs done, or she needs to drop them off so I can review them in more detail  Leeanne Rio, MD

## 2015-07-11 ENCOUNTER — Telehealth: Payer: Self-pay | Admitting: Family Medicine

## 2015-07-11 ENCOUNTER — Ambulatory Visit: Payer: BLUE CROSS/BLUE SHIELD | Admitting: Family Medicine

## 2015-07-11 NOTE — Telephone Encounter (Signed)
Noted Katherine Lashley J Demetrion Wesby, MD  

## 2015-07-11 NOTE — Telephone Encounter (Signed)
Will drop of the papers Dr Ardelia Mems wanted sometime tomorrow

## 2015-07-19 ENCOUNTER — Encounter: Payer: Self-pay | Admitting: Family Medicine

## 2015-07-19 ENCOUNTER — Telehealth: Payer: Self-pay | Admitting: Family Medicine

## 2015-07-19 NOTE — Telephone Encounter (Signed)
Pt is calling back to speak to Dr. Ardelia Mems about something she forgot to ask when they were on the phone. jw

## 2015-07-19 NOTE — Telephone Encounter (Signed)
Pt called and was checking the status of the forms she dropped off. She needs them right away so that she doesn't loose her job. Please call patient and give her an update on the progess. jw

## 2015-07-19 NOTE — Telephone Encounter (Signed)
Spoke with patient twice. Reviewed the old FCE & request for accommodations she brought in from 2011/2012. Patient does not know what she needs for work. Just says she needs "documentation" Explained I will write letter requesting reasonable accommodations for her, based on prior request form and her current FCE. Letter written & placed at front desk for pickup. I am including two copies each of the letter and her FCE, so that patient can keep a copy for her records, and have one to turn in for her job. Patient appreciative.  Leeanne Rio, MD

## 2015-07-20 ENCOUNTER — Telehealth: Payer: Self-pay | Admitting: Family Medicine

## 2015-07-20 NOTE — Telephone Encounter (Signed)
Patient asks PCP if she can do a new letter for her job. Patient left copy of former letter with the changes she needs. Please, follow up with Patient.

## 2015-07-20 NOTE — Telephone Encounter (Signed)
See separate phone note. Brittany J McIntyre, MD  

## 2015-07-20 NOTE — Telephone Encounter (Signed)
New letter written, removing parts patient requested removed. Will place two copies at front desk. Please inform patient it is ready for her to pick up.  Thanks, Leeanne Rio, MD

## 2015-07-20 NOTE — Telephone Encounter (Signed)
Pt called because she would like to speak to Dr. Ardelia Mems about the information she put on the forms. jw

## 2015-07-21 ENCOUNTER — Telehealth: Payer: Self-pay | Admitting: Family Medicine

## 2015-07-21 ENCOUNTER — Encounter: Payer: Self-pay | Admitting: Family Medicine

## 2015-07-21 NOTE — Telephone Encounter (Signed)
Pt is here to pick up forms. Pt states everything is correct except for letter. She states that the letter needs to say that her overall level of work should be "light to medium" not just "light" Pt states that place of employment does not offer "light" work. Old letter placed in provider's box. Please advise. Sadie Reynolds, ASA

## 2015-07-21 NOTE — Telephone Encounter (Signed)
Patient informed, expressed understanding. 

## 2015-07-21 NOTE — Telephone Encounter (Signed)
Spoke with patient. Adjusted letter to say that she feels capable of doing light to medium work. Printed 2  New copies. Patient came and picked them up.  Leeanne Rio, MD

## 2015-08-17 ENCOUNTER — Encounter: Payer: Self-pay | Admitting: Family Medicine

## 2015-08-17 ENCOUNTER — Other Ambulatory Visit (HOSPITAL_COMMUNITY)
Admission: RE | Admit: 2015-08-17 | Discharge: 2015-08-17 | Disposition: A | Discharge status: Home / Self Care | Payer: BLUE CROSS/BLUE SHIELD | Source: Ambulatory Visit | Attending: Family Medicine | Admitting: Family Medicine

## 2015-08-17 ENCOUNTER — Ambulatory Visit (INDEPENDENT_AMBULATORY_CARE_PROVIDER_SITE_OTHER): Payer: BLUE CROSS/BLUE SHIELD | Admitting: Family Medicine

## 2015-08-17 ENCOUNTER — Telehealth: Payer: Self-pay | Admitting: Family Medicine

## 2015-08-17 VITALS — BP 97/64 | HR 81 | Temp 98.0°F | Wt 127.0 lb

## 2015-08-17 DIAGNOSIS — N760 Acute vaginitis: Principal | ICD-10-CM

## 2015-08-17 DIAGNOSIS — Z113 Encounter for screening for infections with a predominantly sexual mode of transmission: Secondary | ICD-10-CM | POA: Diagnosis not present

## 2015-08-17 DIAGNOSIS — B9689 Other specified bacterial agents as the cause of diseases classified elsewhere: Secondary | ICD-10-CM | POA: Insufficient documentation

## 2015-08-17 DIAGNOSIS — N898 Other specified noninflammatory disorders of vagina: Secondary | ICD-10-CM | POA: Diagnosis not present

## 2015-08-17 DIAGNOSIS — Z202 Contact with and (suspected) exposure to infections with a predominantly sexual mode of transmission: Secondary | ICD-10-CM

## 2015-08-17 DIAGNOSIS — Z3009 Encounter for other general counseling and advice on contraception: Secondary | ICD-10-CM

## 2015-08-17 DIAGNOSIS — Z20828 Contact with and (suspected) exposure to other viral communicable diseases: Secondary | ICD-10-CM

## 2015-08-17 LAB — POCT WET PREP (WET MOUNT): CLUE CELLS WET PREP WHIFF POC: POSITIVE

## 2015-08-17 MED ORDER — METRONIDAZOLE 500 MG PO TABS: 500.0000 mg | ORAL_TABLET | Freq: Two times a day (BID) | ORAL | Status: DC

## 2015-08-17 NOTE — Assessment & Plan Note (Signed)
Concerning for infectious process given green color Wet prep, GC/CT collected today Treat as indicated when resulted

## 2015-08-17 NOTE — Assessment & Plan Note (Signed)
Discussed multiple contraceptive options with patient Hand out on various methods given Advised condom backup especially if missing OCPs Patient to follow-up with PCP for further management

## 2015-08-17 NOTE — Assessment & Plan Note (Signed)
HIV and RPR screening also completed today

## 2015-08-17 NOTE — Telephone Encounter (Signed)
Called patient about wet prep results. +BV and trichomonas.  Treat with flagyl 500mg  BID x7d.  Advised that partner will need to be treated as well.  Condom use for next week until both are treated.  Will call with HIV, RPR, GC/CT results when available.  PAtient expressd understanding.  Virginia Crews, MD, MPH PGY-2,  [facility] 08/17/2015 11:46 AM

## 2015-08-17 NOTE — Progress Notes (Signed)
   Subjective:   Katherine Woods is a 40 y.o. female with a history of ovarian cysts, Trichomonas, TBI here for vaginal discharge  Patient reports vaginal discharge for 3-5 days. She reports discharge started after she douched any scented panty liners after her menstrual cycle. The discharge is malodorous and a little itchy. She does not know what it looks like. LMP 08/10/15. She is sexually active with one female partner currently. She does not use condoms. She is taking her birth control pill. She would like to talk about other contraceptive options as she is not great at remembering to take her pill every day.  Review of Systems:  Per HPI.   Social History: Never smoker  Objective:  BP 97/64 mmHg  Pulse 81  Temp(Src) 98 F (36.7 C) (Oral)  Wt 127 lb (57.607 kg)  LMP 08/10/2015  Gen:  40 y.o. female in NAD  HEENT: NCAT, MMM, anicteric sclerae Resp: Non-labored Abd: Soft, NTND, BS present, no guarding or organomegaly GYN:  External genitalia within normal limits. discuss multiple contraceptive options with the patient   Vaginal mucosa pink, moist, normal rugae.  Nonfriable cervix without lesions, no  bleeding noted on speculum exam.  +green thin and malodorous discharge on exam.  Bimanual exam revealed normal, nongravid uterus.  No cervical motion tenderness. No adnexal masses bilaterally.   Ext: WWP, no edema MSK: no obvious deformities Neuro: Alert and oriented, speech normal     Assessment & Plan:     Katherine Woods is a 40 y.o. female here for vaginal discharge  Vaginal discharge Concerning for infectious process given green color Wet prep, GC/CT collected today Treat as indicated when resulted  Screening for STD (sexually transmitted disease) HIV and RPR screening also completed today  Contraception management Discussed multiple contraceptive options with patient Hand out on various methods given Advised condom backup especially if missing OCPs Patient to follow-up  with PCP for further management    Virginia Crews, MD MPH PGY-2,  [facility] 08/17/2015  10:15 AM

## 2015-08-17 NOTE — Patient Instructions (Signed)
Nice to meet you today.  We are getting some labs and someone will call you with the results when they are available.  Come back to see your PCP to discuss possible changes in birth control method.  Look at the information sheet we gave you today.  Take care, Dr. B  Vaginitis Vaginitis is an inflammation of the vagina. It can happen when the normal bacteria and yeast in the vagina grow too much. There are different types. Treatment will depend on the type you have. HOME CARE  Take all medicines as told by your doctor.  Keep your vagina area clean and dry. Avoid soap. Rinse the area with water.  Avoid washing and cleaning out the vagina (douching).  Do not use tampons or have sex (intercourse) until your treatment is done.  Wipe from front to back after going to the restroom.  Wear cotton underwear.  Avoid wearing underwear while you sleep until your vaginitis is gone.  Avoid tight pants. Avoid underwear or nylons without a cotton panel.  Take off wet clothing (such as a bathing suit) as soon as you can.  Use mild, unscented products. Avoid fabric softeners and scented:  Feminine sprays.  Laundry detergents.  Tampons.  Soaps or bubble baths.  Practice safe sex and use condoms. GET HELP RIGHT AWAY IF:   You have belly (abdominal) pain.  You have a fever or lasting symptoms for more than 2-3 days.  You have a fever and your symptoms suddenly get worse. MAKE SURE YOU:   Understand these instructions.  Will watch this condition.  Will get help right away if you are not doing well or get worse.   This information is not intended to replace advice given to you by your health care provider. Make sure you discuss any questions you have with your health care provider.   Document Released: 08/23/2008 Document Revised: 02/19/2012 Document Reviewed: 11/07/2011 Elsevier Interactive Patient Education [employer].

## 2015-08-18 LAB — HIV ANTIBODY (ROUTINE TESTING W REFLEX): HIV 1&2 Ab, 4th Generation: NONREACTIVE

## 2015-08-18 LAB — CERVICOVAGINAL ANCILLARY ONLY
CHLAMYDIA, DNA PROBE: NEGATIVE
NEISSERIA GONORRHEA: NEGATIVE

## 2015-08-18 LAB — RPR

## 2015-08-21 ENCOUNTER — Telehealth: Payer: Self-pay | Admitting: *Deleted

## 2015-08-21 ENCOUNTER — Encounter: Payer: Self-pay | Admitting: *Deleted

## 2015-08-21 NOTE — Telephone Encounter (Signed)
Please let patient know that HIV, RPR, GC/CT all negative. Thanks!        Virginia Crews, MD, MPH    PGY-2, [facility]    08/21/2015 9:27 AM

## 2015-08-21 NOTE — Telephone Encounter (Signed)
Pt informed of the below. Athalie Newhard, Salome Spotted, CMA

## 2015-10-25 ENCOUNTER — Other Ambulatory Visit (HOSPITAL_COMMUNITY)
Admission: RE | Admit: 2015-10-25 | Discharge: 2015-10-25 | Disposition: A | Discharge status: Home / Self Care | Payer: BLUE CROSS/BLUE SHIELD | Source: Ambulatory Visit | Attending: Family Medicine | Admitting: Family Medicine

## 2015-10-25 ENCOUNTER — Encounter: Payer: Self-pay | Admitting: Family Medicine

## 2015-10-25 ENCOUNTER — Ambulatory Visit: Payer: BLUE CROSS/BLUE SHIELD | Admitting: Family Medicine

## 2015-10-25 ENCOUNTER — Ambulatory Visit (INDEPENDENT_AMBULATORY_CARE_PROVIDER_SITE_OTHER): Payer: BLUE CROSS/BLUE SHIELD | Admitting: Family Medicine

## 2015-10-25 VITALS — BP 99/62 | HR 84 | Temp 98.2°F | Ht 62.0 in | Wt 123.4 lb

## 2015-10-25 DIAGNOSIS — Z1151 Encounter for screening for human papillomavirus (HPV): Secondary | ICD-10-CM | POA: Insufficient documentation

## 2015-10-25 DIAGNOSIS — Z01419 Encounter for gynecological examination (general) (routine) without abnormal findings: Secondary | ICD-10-CM | POA: Diagnosis not present

## 2015-10-25 DIAGNOSIS — Z124 Encounter for screening for malignant neoplasm of cervix: Secondary | ICD-10-CM | POA: Diagnosis not present

## 2015-10-25 DIAGNOSIS — N898 Other specified noninflammatory disorders of vagina: Secondary | ICD-10-CM

## 2015-10-25 LAB — POCT WET PREP (WET MOUNT): Clue Cells Wet Prep Whiff POC: POSITIVE

## 2015-10-25 NOTE — Progress Notes (Signed)
Date of Visit: 10/25/2015   HPI:  Patient presents for a same day appointment to discuss possible vaginal infection.  Seen 3/9 here at [facility] by Dr. Brita Romp for vaginal discharge. Diagnosed with trichomonas and BV, treated with flagyl 500mg  twice daily for 7 days.  Patient reports douching. She has douched since being treated for trichomonas and thinks she may have recontracted it. Having brownish discharge. Period is about to start. Using loestrin birth control pills. Sexually active with one female partner in the last year. Partner was treated for trich as well. Does not use condoms. No pelvic pain.  ROS: See HPI  [place]: history of herpes zoster, allergic conjunctivitis, BV, trichomonas, traumatic brain injury  PHYSICAL EXAM: BP 99/62 mmHg  Pulse 84  Temp(Src) 98.2 F (36.8 C) (Oral)  Ht 5\' 2"  (1.575 m)  Wt 123 lb 6.4 oz (55.974 kg)  BMI 22.56 kg/m2 Gen: NAD, pleasant, cooperative GU: normal appearing external genitalia without lesions. Vagina is moist with thin yellow/gray/green discharge. Cervix appears irritated and is easily friable with collection of pap smear. No cervical motion tenderness or tenderness on bimanual exam. No adnexal masses.   ASSESSMENT/PLAN:  1. 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 Also update pap today Will call patient with results once available  FOLLOW UP: Follow up as needed if symptoms worsen or fail to improve.    Leon. Ardelia Mems, [place]

## 2015-10-25 NOTE — Patient Instructions (Signed)
I will call you with your results The preliminary test looks normal  Be well, Dr. Ardelia Mems

## 2015-10-26 ENCOUNTER — Telehealth: Payer: Self-pay | Admitting: Family Medicine

## 2015-10-26 LAB — CYTOLOGY - PAP

## 2015-10-26 NOTE — Telephone Encounter (Signed)
Attempted to return call to patient, no answer. Did not leave vm. Will call again tomrorow. Leeanne Rio, MD

## 2015-10-26 NOTE — Telephone Encounter (Signed)
Need to get results from pap smear and need different b/c rx.

## 2015-10-27 MED ORDER — METRONIDAZOLE 500 MG PO TABS: 500.0000 mg | ORAL_TABLET | Freq: Two times a day (BID) | ORAL | Status: DC

## 2015-10-27 MED ORDER — NORGESTIMATE-ETH ESTRADIOL 0.25-35 MG-MCG PO TABS: 1.0000 | ORAL_TABLET | Freq: Every day | ORAL | Status: DC

## 2015-10-27 NOTE — Telephone Encounter (Signed)
Returned call to patient Informed of normal pap smear but positive trichomonas Patient opts for 7 day course of BV Had previously counseled on importance of partner treatment  Patient also would like different form of OCP If she does not take pill at exact same time each day has breakthrough bleeding Likely she needs higher dose of estrogen Will d/c loestrin 69fe (21mcg of estrogen) and switch to sprintec (7mcg of estrogen)  Patient appreciative.  Leeanne Rio, MD

## 2016-04-02 ENCOUNTER — Ambulatory Visit: Payer: BLUE CROSS/BLUE SHIELD | Admitting: Family Medicine

## 2016-04-09 ENCOUNTER — Ambulatory Visit (INDEPENDENT_AMBULATORY_CARE_PROVIDER_SITE_OTHER): Payer: BLUE CROSS/BLUE SHIELD | Admitting: Family Medicine

## 2016-04-09 ENCOUNTER — Encounter: Payer: Self-pay | Admitting: Family Medicine

## 2016-04-09 VITALS — BP 99/48 | HR 77 | Temp 97.9°F | Ht 62.0 in | Wt 126.0 lb

## 2016-04-09 DIAGNOSIS — Z3009 Encounter for other general counseling and advice on contraception: Secondary | ICD-10-CM

## 2016-04-09 DIAGNOSIS — M940 Chondrocostal junction syndrome [Tietze]: Secondary | ICD-10-CM

## 2016-04-09 NOTE — Assessment & Plan Note (Signed)
Reviewed available options with patient. Not good at remembering to take pill every day But, patient not interested in any LARCs and wants to retain her ability to get pregnant in the future Handout given, patient will contemplate her options. In the meantime, continue sprintec.

## 2016-04-09 NOTE — Assessment & Plan Note (Signed)
No abnormalities on breast exam or chest wall exam Will check xray of ribs per patient preference Counseled to try ibuprofen, heat, ice pack when/if it flares up again. Given handout explaining the nature of this condition.

## 2016-04-09 NOTE — Patient Instructions (Addendum)
Getting rib xray I doubt it will show anything bad Will call you or send letter with results When pain flares, try ibuprofen, heating pad, ice pack  See handout below on birth control options Come in for a visit if you decide to switch to something different  Be well, Dr. Ardelia Mems    Costochondritis Costochondritis, sometimes called Tietze syndrome, is a swelling and irritation (inflammation) of the tissue (cartilage) that connects your ribs with your breastbone (sternum). It causes pain in the chest and rib area. Costochondritis usually goes away on its own over time. It can take up to 6 weeks or longer to get better, especially if you are unable to limit your activities. CAUSES  Some cases of costochondritis have no known cause. Possible causes include:  Injury (trauma).  Exercise or activity such as lifting.  Severe coughing. SIGNS AND SYMPTOMS  Pain and tenderness in the chest and rib area.  Pain that gets worse when coughing or taking deep breaths.  Pain that gets worse with specific movements. DIAGNOSIS  Your health care provider will do a physical exam and ask about your symptoms. Chest X-rays or other tests may be done to rule out other problems. TREATMENT  Costochondritis usually goes away on its own over time. Your health care provider may prescribe medicine to help relieve pain. HOME CARE INSTRUCTIONS   Avoid exhausting physical activity. Try not to strain your ribs during normal activity. This would include any activities using chest, abdominal, and side muscles, especially if heavy weights are used.  Apply ice to the affected area for the first 2 days after the pain begins.  Put ice in a plastic bag.  Place a towel between your skin and the bag.  Leave the ice on for 20 minutes, 2-3 times a day.  Only take over-the-counter or prescription medicines as directed by your health care provider. SEEK MEDICAL CARE IF:  You have redness or swelling at the rib  joints. These are signs of infection.  Your pain does not go away despite rest or medicine. SEEK IMMEDIATE MEDICAL CARE IF:   Your pain increases or you are very uncomfortable.  You have shortness of breath or difficulty breathing.  You cough up blood.  You have worse chest pains, sweating, or vomiting.  You have a fever or persistent symptoms for more than 2-3 days.  You have a fever and your symptoms suddenly get worse. MAKE SURE YOU:   Understand these instructions.  Will watch your condition.  Will get help right away if you are not doing well or get worse.   This information is not intended to replace advice given to you by your health care provider. Make sure you discuss any questions you have with your health care provider.   Document Released: 03/06/2005 Document Revised: 03/17/2013 Document Reviewed: 12/29/2012 Elsevier Interactive Patient Education [employer].   Contraception Choices Contraception (birth control) is the use of any methods or devices to prevent pregnancy. Below are some methods to help avoid pregnancy. HORMONAL METHODS   Contraceptive implant. This is a thin, plastic tube containing progesterone hormone. It does not contain estrogen hormone. Your health care provider inserts the tube in the inner part of the upper arm. The tube can remain in place for up to 3 years. After 3 years, the implant must be removed. The implant prevents the ovaries from releasing an egg (ovulation), thickens the cervical mucus to prevent sperm from entering the uterus, and thins the lining of the  inside of the uterus.  Progesterone-only injections. These injections are given every 3 months by your health care provider to prevent pregnancy. This synthetic progesterone hormone stops the ovaries from releasing eggs. It also thickens cervical mucus and changes the uterine lining. This makes it harder for sperm to survive in the uterus.  Birth control pills. These pills  contain estrogen and progesterone hormone. They work by preventing the ovaries from releasing eggs (ovulation). They also cause the cervical mucus to thicken, preventing the sperm from entering the uterus. Birth control pills are prescribed by a health care provider.Birth control pills can also be used to treat heavy periods.  Minipill. This type of birth control pill contains only the progesterone hormone. They are taken every day of each month and must be prescribed by your health care provider.  Birth control patch. The patch contains hormones similar to those in birth control pills. It must be changed once a week and is prescribed by a health care provider.  Vaginal ring. The ring contains hormones similar to those in birth control pills. It is left in the vagina for 3 weeks, removed for 1 week, and then a new one is put back in place. The patient must be comfortable inserting and removing the ring from the vagina.A health care provider's prescription is necessary.  Emergency contraception. Emergency contraceptives prevent pregnancy after unprotected sexual intercourse. This pill can be taken right after sex or up to 5 days after unprotected sex. It is most effective the sooner you take the pills after having sexual intercourse. Most emergency contraceptive pills are available without a prescription. Check with your pharmacist. Do not use emergency contraception as your only form of birth control. BARRIER METHODS   Female condom. This is a thin sheath (latex or rubber) that is worn over the penis during sexual intercourse. It can be used with spermicide to increase effectiveness.  Female condom. This is a soft, loose-fitting sheath that is put into the vagina before sexual intercourse.  Diaphragm. This is a soft, latex, dome-shaped barrier that must be fitted by a health care provider. It is inserted into the vagina, along with a spermicidal jelly. It is inserted before intercourse. The diaphragm  should be left in the vagina for 6 to 8 hours after intercourse.  Cervical cap. This is a round, soft, latex or plastic cup that fits over the cervix and must be fitted by a health care provider. The cap can be left in place for up to 48 hours after intercourse.  Sponge. This is a soft, circular piece of polyurethane foam. The sponge has spermicide in it. It is inserted into the vagina after wetting it and before sexual intercourse.  Spermicides. These are chemicals that kill or block sperm from entering the cervix and uterus. They come in the form of creams, jellies, suppositories, foam, or tablets. They do not require a prescription. They are inserted into the vagina with an applicator before having sexual intercourse. The process must be repeated every time you have sexual intercourse. INTRAUTERINE CONTRACEPTION  Intrauterine device (IUD). This is a T-shaped device that is put in a woman's uterus during a menstrual period to prevent pregnancy. There are 2 types:  Copper IUD. This type of IUD is wrapped in copper wire and is placed inside the uterus. Copper makes the uterus and fallopian tubes produce a fluid that kills sperm. It can stay in place for 10 years.  Hormone IUD. This type of IUD contains the hormone progestin (synthetic  progesterone). The hormone thickens the cervical mucus and prevents sperm from entering the uterus, and it also thins the uterine lining to prevent implantation of a fertilized egg. The hormone can weaken or kill the sperm that get into the uterus. It can stay in place for 3-5 years, depending on which type of IUD is used. PERMANENT METHODS OF CONTRACEPTION  Female tubal ligation. This is when the woman's fallopian tubes are surgically sealed, tied, or blocked to prevent the egg from traveling to the uterus.  Hysteroscopic sterilization. This involves placing a small coil or insert into each fallopian tube. Your doctor uses a technique called hysteroscopy to do the  procedure. The device causes scar tissue to form. This results in permanent blockage of the fallopian tubes, so the sperm cannot fertilize the egg. It takes about 3 months after the procedure for the tubes to become blocked. You must use another form of birth control for these 3 months.  Female sterilization. This is when the female has the tubes that carry sperm tied off (vasectomy).This blocks sperm from entering the vagina during sexual intercourse. After the procedure, the man can still ejaculate fluid (semen). NATURAL PLANNING METHODS  Natural family planning. This is not having sexual intercourse or using a barrier method (condom, diaphragm, cervical cap) on days the woman could become pregnant.  Calendar method. This is keeping track of the length of each menstrual cycle and identifying when you are fertile.  Ovulation method. This is avoiding sexual intercourse during ovulation.  Symptothermal method. This is avoiding sexual intercourse during ovulation, using a thermometer and ovulation symptoms.  Post-ovulation method. This is timing sexual intercourse after you have ovulated. Regardless of which type or method of contraception you choose, it is important that you use condoms to protect against the transmission of sexually transmitted infections (STIs). Talk with your health care provider about which form of contraception is most appropriate for you.   This information is not intended to replace advice given to you by your health care provider. Make sure you discuss any questions you have with your health care provider.   Document Released: 05/27/2005 Document Revised: 06/01/2013 Document Reviewed: 11/19/2012 Elsevier Interactive Patient Education [employer].

## 2016-04-09 NOTE — Progress Notes (Signed)
Date of Visit: 04/09/2016   HPI:  Patient presents to discuss pain in her anterior chest wall.  Pain has occurred intermittently throughout her life. Last occurred several weeks ago, and lasted on and off for about a week. Becomes sore to touch in a bone on the left anterior part of her chest. Does not take any medications for this when it happens. Is not hurting now. Denies fever, cough, shortness of breath. No problems with her breasts. No family history of breast cancer. No nipple drainage or breast lumps.  Birth control - currently using sprintec. Switched to this several months back as she was having uterine bleeding if she didn't take it at the same time each day. That has improved. The issue is she is not good at remembering to take the pills every day.  ROS: See HPI.  [place]: history of trichomonas, BV, TBI, ovarian cyst  PHYSICAL EXAM: BP (!) 99/48   Pulse 77   Temp 97.9 F (36.6 C) (Oral)   Ht 5\' 2"  (1.575 m)   Wt 126 lb (57.2 kg)   LMP 04/02/2016   BMI 23.05 kg/m  Gen: NAD, pleasant, cooperative HEENT: normocephalic, atraumatic, moist mucous membranes. No anterior cervical or supraclavicular lymphadenopathy.  Heart: regular rate and rhythm  Lungs: normal work of breathing  Neuro: alert grossly nonfocal speech normal Chest wall: no tenderness to palpation of anterior chest wall. No deformities or masses Breasts: bilateral breasts normal in appearance. No erythema, deformity, or nipple discharge. No palpable abnormal masses. No axillary lymphadenopathy.   ASSESSMENT/PLAN:  Contraception management Reviewed available options with patient. Not good at remembering to take pill every day But, patient not interested in any LARCs and wants to retain her ability to get pregnant in the future Handout given, patient will contemplate her options. In the meantime, continue sprintec.  Costochondritis No abnormalities on breast exam or chest wall exam Will check xray of ribs per  patient preference Counseled to try ibuprofen, heat, ice pack when/if it flares up again. Given handout explaining the nature of this condition.   FOLLOW UP: Follow up as needed if symptoms worsen or fail to improve.    [place]. Ardelia Mems, [place]

## 2016-04-11 ENCOUNTER — Other Ambulatory Visit: Payer: Self-pay | Admitting: Family Medicine

## 2016-04-11 DIAGNOSIS — Z1231 Encounter for screening mammogram for malignant neoplasm of breast: Secondary | ICD-10-CM

## 2016-04-11 NOTE — Telephone Encounter (Signed)
Pt would like pcp to call in [facility]. Pt uses [facility] on [address]. Please advise. Thanks! ep

## 2016-04-15 NOTE — Telephone Encounter (Signed)
2nd request. Pt would like to be called once medication is called in. Please advise. Thanks! ep

## 2016-04-18 NOTE — Telephone Encounter (Signed)
3rd request. Please advise. Pt also wanted to let PCP know that she scheduled a mammogram for Nov. 17th and will also go to get chest x-ray between now and then. Please advise. Thanks! ep

## 2016-04-18 NOTE — Telephone Encounter (Signed)
I called patient to discuss this. No answer. lvm asking her to call back. I need to speak with her on the phone about this before I can change her birth control. When she calls back feel free to page me and I can try to get on the phone with her.  Leeanne Rio, MD

## 2016-04-24 MED ORDER — ORTHO EVRA 150-35 MCG/24HR TD PTWK: 1.0000 | MEDICATED_PATCH | TRANSDERMAL | 12 refills | Status: DC

## 2016-04-24 NOTE — Telephone Encounter (Signed)
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 She wants to switch to brand ortho evra patch. Used to use this and it worked well for her. Her pharmacy at one point switched her to a generic version which she did not like as much Has not taken OCPs in about a month. Denies possibility of pregnancy Plans to start on the first Sunday after her next menstrual period. Needs it sent in to [facility] on [address] as they have brand name patches in stock.  Will send these in as name brand request.  Leeanne Rio, MD

## 2016-04-26 ENCOUNTER — Telehealth: Payer: Self-pay | Admitting: Family Medicine

## 2016-04-26 ENCOUNTER — Ambulatory Visit: Payer: BLUE CROSS/BLUE SHIELD

## 2016-04-26 NOTE — Telephone Encounter (Signed)
FYI to MD

## 2016-04-26 NOTE — Telephone Encounter (Signed)
Had to reschedule todays mammogram to dec 1.  Just wanted dr Ardelia Mems to know

## 2016-04-26 NOTE — Telephone Encounter (Signed)
Noted Brittany J McIntyre, MD  

## 2016-05-07 ENCOUNTER — Telehealth: Payer: Self-pay | Admitting: Family Medicine

## 2016-05-07 NOTE — Telephone Encounter (Signed)
Her other BC pill has been discontinued from the manufacturer.  She would like to try the Zulane patch agai. [facility] on [address]

## 2016-05-08 MED ORDER — NORELGESTROMIN-ETH ESTRADIOL 150-35 MCG/24HR TD PTWK: 1.0000 | MEDICATED_PATCH | TRANSDERMAL | 12 refills | Status: DC

## 2016-05-08 NOTE — Telephone Encounter (Signed)
Called patient to clarify - they no longer make brand name ortho evra patches, which is what she wanted. Just needs generic ortho evra patches sent to her pharmacy. Have done this.  Leeanne Rio, MD

## 2016-05-10 ENCOUNTER — Ambulatory Visit: Payer: BLUE CROSS/BLUE SHIELD

## 2016-06-07 ENCOUNTER — Ambulatory Visit
Admission: RE | Admit: 2016-06-07 | Discharge: 2016-06-07 | Disposition: A | Discharge status: Home / Self Care | Payer: BLUE CROSS/BLUE SHIELD | Source: Ambulatory Visit | Attending: Family Medicine | Admitting: Family Medicine

## 2016-06-07 DIAGNOSIS — Z1231 Encounter for screening mammogram for malignant neoplasm of breast: Secondary | ICD-10-CM | POA: Diagnosis not present

## 2016-10-25 ENCOUNTER — Telehealth: Payer: Self-pay | Admitting: Family Medicine

## 2016-10-29 ENCOUNTER — Other Ambulatory Visit: Payer: Self-pay | Admitting: Family Medicine

## 2016-12-27 ENCOUNTER — Other Ambulatory Visit (HOSPITAL_COMMUNITY)
Admission: RE | Admit: 2016-12-27 | Discharge: 2016-12-27 | Disposition: A | Discharge status: Home / Self Care | Payer: BLUE CROSS/BLUE SHIELD | Source: Ambulatory Visit | Attending: Family Medicine | Admitting: Family Medicine

## 2016-12-27 ENCOUNTER — Ambulatory Visit (INDEPENDENT_AMBULATORY_CARE_PROVIDER_SITE_OTHER): Payer: BLUE CROSS/BLUE SHIELD | Admitting: *Deleted

## 2016-12-27 ENCOUNTER — Encounter: Payer: Self-pay | Admitting: Family Medicine

## 2016-12-27 ENCOUNTER — Ambulatory Visit (INDEPENDENT_AMBULATORY_CARE_PROVIDER_SITE_OTHER): Payer: BLUE CROSS/BLUE SHIELD | Admitting: Family Medicine

## 2016-12-27 VITALS — BP 106/66 | HR 83

## 2016-12-27 VITALS — BP 88/52 | HR 77 | Temp 97.9°F | Ht 62.0 in | Wt 128.6 lb

## 2016-12-27 DIAGNOSIS — M542 Cervicalgia: Secondary | ICD-10-CM | POA: Diagnosis not present

## 2016-12-27 DIAGNOSIS — R5383 Other fatigue: Secondary | ICD-10-CM | POA: Diagnosis not present

## 2016-12-27 DIAGNOSIS — M940 Chondrocostal junction syndrome [Tietze]: Secondary | ICD-10-CM

## 2016-12-27 DIAGNOSIS — Z113 Encounter for screening for infections with a predominantly sexual mode of transmission: Secondary | ICD-10-CM | POA: Insufficient documentation

## 2016-12-27 DIAGNOSIS — Z0000 Encounter for general adult medical examination without abnormal findings: Secondary | ICD-10-CM | POA: Diagnosis not present

## 2016-12-27 DIAGNOSIS — Z13220 Encounter for screening for lipoid disorders: Secondary | ICD-10-CM | POA: Diagnosis not present

## 2016-12-27 DIAGNOSIS — Z0130 Encounter for examination of blood pressure without abnormal findings: Secondary | ICD-10-CM

## 2016-12-27 MED ORDER — BACLOFEN 10 MG PO TABS: 5.0000 mg | ORAL_TABLET | Freq: Three times a day (TID) | ORAL | 0 refills | Status: DC | PRN

## 2016-12-27 NOTE — Progress Notes (Signed)
She does not need to keep that blood pressure appointment. Red team, can you call her and let her know she can cancel that visit?  Thanks Leeanne Rio, MD

## 2016-12-27 NOTE — Progress Notes (Signed)
Date of Visit: 12/27/2016   HPI:  Patient presents today for a well woman exam.   Concerns today: neck pain, see below Periods: monthly, no issues Contraception: patch, likes this Pelvic symptoms: denies vaginal discharge or pelvic pain Sexual activity: not presently STD Screening: would like this today, prefers urine testing Pap smear status: UTD Exercise: none Diet: eats lots of fast food Smoking: no Alcohol: no Drugs: no  Neck pain: For a few weeks she has had pain in the left aspect of her posterior neck. Denies any preceding injuries. She flipped her mattress and botany pillows, but has not had much relief. Has not tried any medications other than icy hot. Denies any fevers or problems with her arms. The pain was constant, and then became intermittent, and now has returned to being more constant. It is mild.  Chest wall pain: On occasion has pain in the bony area of her anterior chest wall. It is not brought on by exertion or accompanied by shortness of breath. Happens about 2 times a month. She is unclear how long it lasts. I saw her for this previously and thought it was costochondritis. UTD on mammogram, normal.  Fatigue - gets cold at night, wants her iron levels checked. Feels fatigued sometimes.  ROS: See HPI.   [place]:  Cancers in family: cousin with colon cancer in 46s, otherwise no cancers  PHYSICAL EXAM: BP (!) 88/52   Pulse 77   Temp 97.9 F (36.6 C) (Oral)   Ht 5\' 2"  (1.575 m)   Wt 128 lb 9.6 oz (58.3 kg)   LMP 12/22/2016 (Exact Date)   SpO2 98%   BMI 23.52 kg/m  Gen: NAD, pleasant, cooperative HEENT: NCAT, PERRL, no palpable thyromegaly or anterior cervical lymphadenopathy Heart: RRR, no murmurs Lungs: CTAB, NWOB Abdomen: soft, nontender to palpation Neuro: grossly nonfocal, speech normal. Full ROM of neck. Strength 5/5 in bilateral upper extremities.   ASSESSMENT/PLAN:  Health maintenance:  -STD screening: urine gc/chl/trich today, HIV, RPR  today -pap smear: UTD -mammogram: UTD -lipid screening: check lipids today along with CMET -immunizations: UTD -handout given on health maintenance topics  Neck pain - likely mild neck spasm. Recommend heating pad/ice, rx small quantity of baclofen.  Fatigue - check TSH, CBC, iron studies  Chest wall pain - sounds more consistent with costochondritis. No cardiac sounding symptoms. Monitor. Recommend tylenol, ibuprofen, heating pad, ice. Follow up if worsening.  Blood pressure - noted low today, though patient tends to run low. I called her after her visit since we did not recheck it before she left. Denies any lightheadedness or dizziness. Offered to have her return for repeat blood pressure check. She wants to come next Friday. Nurse BP check scheduled for patient. She was appreciative.  FOLLOW UP: Follow up in 1 year for next physical 1 week for RN BP check.  [place]. Ardelia Mems, [place]

## 2016-12-27 NOTE — Patient Instructions (Addendum)
It was great to see you again today!  Checking labwork  For area on your chest - use tylenol, ibuprofen, ice, heat as needed. Return if worsening.  Sent in baclofen for your neck (muscle relaxer)  Follow up in 1 year for your next physical  Be well, Dr. Ardelia Mems    Health Maintenance, Female Adopting a healthy lifestyle and getting preventive care can go a long way to promote health and wellness. Talk with your health care provider about what schedule of regular examinations is right for you. This is a good chance for you to check in with your provider about disease prevention and staying healthy. In between checkups, there are plenty of things you can do on your own. Experts have done a lot of research about which lifestyle changes and preventive measures are most likely to keep you healthy. Ask your health care provider for more information. Weight and diet Eat a healthy diet  Be sure to include plenty of vegetables, fruits, low-fat dairy products, and lean protein.  Do not eat a lot of foods high in solid fats, added sugars, or salt.  Get regular exercise. This is one of the most important things you can do for your health. ? Most adults should exercise for at least 150 minutes each week. The exercise should increase your heart rate and make you sweat (moderate-intensity exercise). ? Most adults should also do strengthening exercises at least twice a week. This is in addition to the moderate-intensity exercise.  Maintain a healthy weight  Body mass index (BMI) is a measurement that can be used to identify possible weight problems. It estimates body fat based on height and weight. Your health care provider can help determine your BMI and help you achieve or maintain a healthy weight.  For females 65 years of age and older: ? A BMI below 18.5 is considered underweight. ? A BMI of 18.5 to 24.9 is normal. ? A BMI of 25 to 29.9 is considered overweight. ? A BMI of 30 and above is  considered obese.  Watch levels of cholesterol and blood lipids  You should start having your blood tested for lipids and cholesterol at 41 years of age, then have this test every 5 years.  You may need to have your cholesterol levels checked more often if: ? Your lipid or cholesterol levels are high. ? You are older than 41 years of age. ? You are at high risk for heart disease.  Cancer screening Lung Cancer  Lung cancer screening is recommended for adults 23-34 years old who are at high risk for lung cancer because of a history of smoking.  A yearly low-dose CT scan of the lungs is recommended for people who: ? Currently smoke. ? Have quit within the past 15 years. ? Have at least a 30-pack-year history of smoking. A pack year is smoking an average of one pack of cigarettes a day for 1 year.  Yearly screening should continue until it has been 15 years since you quit.  Yearly screening should stop if you develop a health problem that would prevent you from having lung cancer treatment.  Breast Cancer  Practice breast self-awareness. This means understanding how your breasts normally appear and feel.  It also means doing regular breast self-exams. Let your health care provider know about any changes, no matter how small.  If you are in your 20s or 30s, you should have a clinical breast exam (CBE) by a health care provider every 1-3  years as part of a regular health exam.  If you are 27 or older, have a CBE every year. Also consider having a breast X-ray (mammogram) every year.  If you have a family history of breast cancer, talk to your health care provider about genetic screening.  If you are at high risk for breast cancer, talk to your health care provider about having an MRI and a mammogram every year.  Breast cancer gene (BRCA) assessment is recommended for women who have family members with BRCA-related cancers. BRCA-related cancers  include: ? Breast. ? Ovarian. ? Tubal. ? Peritoneal cancers.  Results of the assessment will determine the need for genetic counseling and BRCA1 and BRCA2 testing.  Cervical Cancer Your health care provider may recommend that you be screened regularly for cancer of the pelvic organs (ovaries, uterus, and vagina). This screening involves a pelvic examination, including checking for microscopic changes to the surface of your cervix (Pap test). You may be encouraged to have this screening done every 3 years, beginning at age 57.  For women ages 42-65, health care providers may recommend pelvic exams and Pap testing every 3 years, or they may recommend the Pap and pelvic exam, combined with testing for human papilloma virus (HPV), every 5 years. Some types of HPV increase your risk of cervical cancer. Testing for HPV may also be done on women of any age with unclear Pap test results.  Other health care providers may not recommend any screening for nonpregnant women who are considered low risk for pelvic cancer and who do not have symptoms. Ask your health care provider if a screening pelvic exam is right for you.  If you have had past treatment for cervical cancer or a condition that could lead to cancer, you need Pap tests and screening for cancer for at least 20 years after your treatment. If Pap tests have been discontinued, your risk factors (such as having a new sexual partner) need to be reassessed to determine if screening should resume. Some women have medical problems that increase the chance of getting cervical cancer. In these cases, your health care provider may recommend more frequent screening and Pap tests.  Colorectal Cancer  This type of cancer can be detected and often prevented.  Routine colorectal cancer screening usually begins at 41 years of age and continues through 41 years of age.  Your health care provider may recommend screening at an earlier age if you have risk factors  for colon cancer.  Your health care provider may also recommend using home test kits to check for hidden blood in the stool.  A small camera at the end of a tube can be used to examine your colon directly (sigmoidoscopy or colonoscopy). This is done to check for the earliest forms of colorectal cancer.  Routine screening usually begins at age 81.  Direct examination of the colon should be repeated every 5-10 years through 41 years of age. However, you may need to be screened more often if early forms of precancerous polyps or small growths are found.  Skin Cancer  Check your skin from head to toe regularly.  Tell your health care provider about any new moles or changes in moles, especially if there is a change in a mole's shape or color.  Also tell your health care provider if you have a mole that is larger than the size of a pencil eraser.  Always use sunscreen. Apply sunscreen liberally and repeatedly throughout the day.  Protect yourself  by wearing long sleeves, pants, a wide-brimmed hat, and sunglasses whenever you are outside.  Heart disease, diabetes, and high blood pressure  High blood pressure causes heart disease and increases the risk of stroke. High blood pressure is more likely to develop in: ? People who have blood pressure in the high end of the normal range (130-139/85-89 mm Hg). ? People who are overweight or obese. ? People who are African American.  If you are 18-39 years of age, have your blood pressure checked every 3-5 years. If you are 40 years of age or older, have your blood pressure checked every year. You should have your blood pressure measured twice-once when you are at a hospital or clinic, and once when you are not at a hospital or clinic. Record the average of the two measurements. To check your blood pressure when you are not at a hospital or clinic, you can use: ? An automated blood pressure machine at a pharmacy. ? A home blood pressure monitor.  If  you are between 55 years and 79 years old, ask your health care provider if you should take aspirin to prevent strokes.  Have regular diabetes screenings. This involves taking a blood sample to check your fasting blood sugar level. ? If you are at a normal weight and have a low risk for diabetes, have this test once every three years after 41 years of age. ? If you are overweight and have a high risk for diabetes, consider being tested at a younger age or more often. Preventing infection Hepatitis B  If you have a higher risk for hepatitis B, you should be screened for this virus. You are considered at high risk for hepatitis B if: ? You were born in a country where hepatitis B is common. Ask your health care provider which countries are considered high risk. ? Your parents were born in a high-risk country, and you have not been immunized against hepatitis B (hepatitis B vaccine). ? You have HIV or AIDS. ? You use needles to inject street drugs. ? You live with someone who has hepatitis B. ? You have had sex with someone who has hepatitis B. ? You get hemodialysis treatment. ? You take certain medicines for conditions, including cancer, organ transplantation, and autoimmune conditions.  Hepatitis C  Blood testing is recommended for: ? Everyone born from 1945 through 1965. ? Anyone with known risk factors for hepatitis C.  Sexually transmitted infections (STIs)  You should be screened for sexually transmitted infections (STIs) including gonorrhea and chlamydia if: ? You are sexually active and are younger than 41 years of age. ? You are older than 41 years of age and your health care provider tells you that you are at risk for this type of infection. ? Your sexual activity has changed since you were last screened and you are at an increased risk for chlamydia or gonorrhea. Ask your health care provider if you are at risk.  If you do not have HIV, but are at risk, it may be recommended  that you take a prescription medicine daily to prevent HIV infection. This is called pre-exposure prophylaxis (PrEP). You are considered at risk if: ? You are sexually active and do not regularly use condoms or know the HIV status of your partner(s). ? You take drugs by injection. ? You are sexually active with a partner who has HIV.  Talk with your health care provider about whether you are at high risk of being infected   with HIV. If you choose to begin PrEP, you should first be tested for HIV. You should then be tested every 3 months for as long as you are taking PrEP. Pregnancy  If you are premenopausal and you may become pregnant, ask your health care provider about preconception counseling.  If you may become pregnant, take 400 to 800 micrograms (mcg) of folic acid every day.  If you want to prevent pregnancy, talk to your health care provider about birth control (contraception). Osteoporosis and menopause  Osteoporosis is a disease in which the bones lose minerals and strength with aging. This can result in serious bone fractures. Your risk for osteoporosis can be identified using a bone density scan.  If you are 65 years of age or older, or if you are at risk for osteoporosis and fractures, ask your health care provider if you should be screened.  Ask your health care provider whether you should take a calcium or vitamin D supplement to lower your risk for osteoporosis.  Menopause may have certain physical symptoms and risks.  Hormone replacement therapy may reduce some of these symptoms and risks. Talk to your health care provider about whether hormone replacement therapy is right for you. Follow these instructions at home:  Schedule regular health, dental, and eye exams.  Stay current with your immunizations.  Do not use any tobacco products including cigarettes, chewing tobacco, or electronic cigarettes.  If you are pregnant, do not drink alcohol.  If you are  breastfeeding, limit how much and how often you drink alcohol.  Limit alcohol intake to no more than 1 drink per day for nonpregnant women. One drink equals 12 ounces of beer, 5 ounces of wine, or 1 ounces of hard liquor.  Do not use street drugs.  Do not share needles.  Ask your health care provider for help if you need support or information about quitting drugs.  Tell your health care provider if you often feel depressed.  Tell your health care provider if you have ever been abused or do not feel safe at home. This information is not intended to replace advice given to you by your health care provider. Make sure you discuss any questions you have with your health care provider. Document Released: 12/10/2010 Document Revised: 11/02/2015 Document Reviewed: 02/28/2015 Elsevier Interactive Patient Education  2018 [employer]  

## 2016-12-27 NOTE — Progress Notes (Signed)
   Patient presents for BP check after low BP this AM Patient experiencing posterior left neck and top of left scapula X 2 weeks; rates 4/10 at present Denies feeling dizzy or lightheaded.  Vitals:   12/27/16 1514  BP: 106/66  Pulse: 83  SpO2 98%  BP taken left arm manually with adult cuff  Patient has appt in one week for another BP check. Please advise if you would like patient to keep that appt.  Hubbard Hartshorn, RN, BSN

## 2016-12-29 LAB — TSH: TSH: 3.79 u[IU]/mL (ref 0.450–4.500)

## 2016-12-29 LAB — CBC
HEMATOCRIT: 34.2 % (ref 34.0–46.6)
HEMOGLOBIN: 11.6 g/dL (ref 11.1–15.9)
MCH: 29.2 pg (ref 26.6–33.0)
MCHC: 33.9 g/dL (ref 31.5–35.7)
MCV: 86 fL (ref 79–97)
Platelets: 290 10*3/uL (ref 150–379)
RBC: 3.97 x10E6/uL (ref 3.77–5.28)
RDW: 13.9 % (ref 12.3–15.4)
WBC: 5.4 10*3/uL (ref 3.4–10.8)

## 2016-12-29 LAB — CMP14+EGFR
ALBUMIN: 4 g/dL (ref 3.5–5.5)
ALT: 12 IU/L (ref 0–32)
AST: 17 IU/L (ref 0–40)
Albumin/Globulin Ratio: 1.4 (ref 1.2–2.2)
Alkaline Phosphatase: 49 IU/L (ref 39–117)
BUN / CREAT RATIO: 21 (ref 9–23)
BUN: 15 mg/dL (ref 6–24)
Bilirubin Total: 0.3 mg/dL (ref 0.0–1.2)
CO2: 24 mmol/L (ref 20–29)
CREATININE: 0.7 mg/dL (ref 0.57–1.00)
Calcium: 8.8 mg/dL (ref 8.7–10.2)
Chloride: 103 mmol/L (ref 96–106)
GFR calc Af Amer: 125 mL/min/{1.73_m2} (ref >59)
GFR, EST NON AFRICAN AMERICAN: 109 mL/min/{1.73_m2} (ref >59)
GLUCOSE: 87 mg/dL (ref 65–99)
Globulin, Total: 2.8 g/dL (ref 1.5–4.5)
Potassium: 4.2 mmol/L (ref 3.5–5.2)
Sodium: 141 mmol/L (ref 134–144)
TOTAL PROTEIN: 6.8 g/dL (ref 6.0–8.5)

## 2016-12-29 LAB — IRON AND TIBC
IRON SATURATION: 21 % (ref 15–55)
IRON: 75 ug/dL (ref 27–159)
Total Iron Binding Capacity: 355 ug/dL (ref 250–450)
UIBC: 280 ug/dL (ref 131–425)

## 2016-12-29 LAB — HIV ANTIBODY (ROUTINE TESTING W REFLEX): HIV Screen 4th Generation wRfx: NONREACTIVE

## 2016-12-29 LAB — LIPID PANEL
CHOL/HDL RATIO: 2.6 ratio (ref 0.0–4.4)
Cholesterol, Total: 239 mg/dL — ABNORMAL HIGH (ref 100–199)
HDL: 91 mg/dL (ref >39)
LDL Calculated: 137 mg/dL — ABNORMAL HIGH (ref 0–99)
Triglycerides: 57 mg/dL (ref 0–149)
VLDL CHOLESTEROL CAL: 11 mg/dL (ref 5–40)

## 2016-12-29 LAB — RPR: RPR: NONREACTIVE

## 2016-12-29 LAB — FERRITIN: Ferritin: 17 ng/mL (ref 15–150)

## 2016-12-30 ENCOUNTER — Telehealth: Payer: Self-pay | Admitting: *Deleted

## 2016-12-30 LAB — URINE CYTOLOGY ANCILLARY ONLY
Chlamydia: NEGATIVE
Neisseria Gonorrhea: NEGATIVE
Trichomonas: NEGATIVE

## 2016-12-30 NOTE — Telephone Encounter (Signed)
LMOVM to inform pt appt is cancelled. Deseree Kennon Holter, CMA

## 2016-12-30 NOTE — Telephone Encounter (Signed)
-----   Message from Leeanne Rio, MD sent at 12/27/2016  3:27 PM EDT -----   ----- Message ----- From: Velora Heckler, RN Sent: 12/27/2016   3:22 PM To: Leeanne Rio, MD

## 2017-01-07 ENCOUNTER — Encounter: Payer: Self-pay | Admitting: Family Medicine

## 2017-04-16 ENCOUNTER — Encounter: Payer: Self-pay | Admitting: Internal Medicine

## 2017-04-16 ENCOUNTER — Ambulatory Visit (INDEPENDENT_AMBULATORY_CARE_PROVIDER_SITE_OTHER): Payer: BLUE CROSS/BLUE SHIELD | Admitting: Internal Medicine

## 2017-04-16 ENCOUNTER — Other Ambulatory Visit: Payer: Self-pay

## 2017-04-16 DIAGNOSIS — M5412 Radiculopathy, cervical region: Secondary | ICD-10-CM | POA: Insufficient documentation

## 2017-04-16 MED ORDER — GABAPENTIN 100 MG PO CAPS: 100.0000 mg | ORAL_CAPSULE | Freq: Three times a day (TID) | ORAL | 3 refills | Status: DC

## 2017-04-16 NOTE — Assessment & Plan Note (Signed)
-   DG Cervical Spine Complete; Future - gabapentin (NEURONTIN) 100 MG capsule; Take 1 capsule (100 mg total) 3 (three) times daily by mouth.  Dispense: 90 capsule; Refill: 3 - Neck exercises given  - Follow up in 1 month if no improvement
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
## 2017-04-16 NOTE — Patient Instructions (Addendum)
I believe you have a a pinched nerve in your neck that is likely causing the numbness that you are having.  I will prescribe a medication called gabapentin to help with this.  I would be careful about taking this as it can cause some drowsiness.  I will set you up for x-rays of your neck.  You can go over to the [facility] hospital to get these done. I recommend that you do the some of the exercises I have given you below as this will likely help improve how fast you recover.  Neck Exercises Neck exercises can be important for many reasons:  They can help you to improve and maintain flexibility in your neck. This can be especially important as you age.  They can help to make your neck stronger. This can make movement easier.  They can reduce or prevent neck pain.  They may help your upper back.  Ask your health care provider which neck exercises would be best for you. Exercises Neck Press Repeat this exercise 10 times. Do it first thing in the morning and right before bed or as told by your health care provider. 1. Lie on your back on a firm bed or on the floor with a pillow under your head. 2. Use your neck muscles to push your head down on the pillow and straighten your spine. 3. Hold the position as well as you can. Keep your head facing up and your chin tucked. 4. Slowly count to 5 while holding this position. 5. Relax for a few seconds. Then repeat.  Isometric Strengthening Do a full set of these exercises 2 times a day or as told by your health care provider. 1. Sit in a supportive chair and place your hand on your forehead. 2. Push forward with your head and neck while pushing back with your hand. Hold for 10 seconds. 3. Relax. Then repeat the exercise 3 times. 4. Next, do thesequence again, this time putting your hand against the back of your head. Use your head and neck to push backward against the hand pressure. 5. Finally, do the same exercise on either side of your head, pushing  sideways against the pressure of your hand.  Prone Head Lifts Repeat this exercise 5 times. Do this 2 times a day or as told by your health care provider. 1. Lie face-down, resting on your elbows so that your chest and upper back are raised. 2. Start with your head facing downward, near your chest. Position your chin either on or near your chest. 3. Slowly lift your head upward. Lift until you are looking straight ahead. Then continue lifting your head as far back as you can stretch. 4. Hold your head up for 5 seconds. Then slowly lower it to your starting position.  Supine Head Lifts Repeat this exercise 8-10 times. Do this 2 times a day or as told by your health care provider. 1. Lie on your back, bending your knees to point to the ceiling and keeping your feet flat on the floor. 2. Lift your head slowly off the floor, raising your chin toward your chest. 3. Hold for 5 seconds. 4. Relax and repeat.  Scapular Retraction Repeat this exercise 5 times. Do this 2 times a day or as told by your health care provider. 1. Stand with your arms at your sides. Look straight ahead. 2. Slowly pull both shoulders backward and downward until you feel a stretch between your shoulder blades in your upper back. 3.  Hold for 10-30 seconds. 4. Relax and repeat.  Contact a health care provider if:  Your neck pain or discomfort gets much worse when you do an exercise.  Your neck pain or discomfort does not improve within 2 hours after you exercise. If you have any of these problems, stop exercising right away. Do not do the exercises again unless your health care provider says that you can. Get help right away if:  You develop sudden, severe neck pain. If this happens, stop exercising right away. Do not do the exercises again unless your health care provider says that you can. Exercises Neck Stretch  Repeat this exercise 3-5 times. 1. Do this exercise while standing or while sitting in a  chair. 2. Place your feet flat on the floor, shoulder-width apart. 3. Slowly turn your head to the right. Turn it all the way to the right so you can look over your right shoulder. Do not tilt or tip your head. 4. Hold this position for 10-30 seconds. 5. Slowly turn your head to the left, to look over your left shoulder. 6. Hold this position for 10-30 seconds.  Neck Retraction Repeat this exercise 8-10 times. Do this 3-4 times a day or as told by your health care provider. 1. Do this exercise while standing or while sitting in a sturdy chair. 2. Look straight ahead. Do not bend your neck. 3. Use your fingers to push your chin backward. Do not bend your neck for this movement. Continue to face straight ahead. If you are doing the exercise properly, you will feel a slight sensation in your throat and a stretch at the back of your neck. 4. Hold the stretch for 1-2 seconds. Relax and repeat.  This information is not intended to replace advice given to you by your health care provider. Make sure you discuss any questions you have with your health care provider. Document Released: 05/08/2015 Document Revised: 11/02/2015 Document Reviewed: 12/05/2014 Elsevier Interactive Patient Education  2017 [employer].

## 2017-04-16 NOTE — Progress Notes (Deleted)
   [facility] [name], MD Phone: [phone]  Reason For Visit:   #  -   Past Medical History Reviewed problem list.  Medications- reviewed and updated No additions to family history Social history- patient is a *** smoker  Objective: BP 108/60   Pulse (!) 101   Temp 98.4 F (36.9 C) (Oral)   Ht 5' 1.5" (1.562 m)   Wt 127 lb (57.6 kg)   LMP 04/13/2017 (Approximate)   SpO2 98%   BMI 23.61 kg/m  Gen: NAD, alert, cooperative with exam HEENT: Normal    Neck: No masses palpated. No lymphadenopathy    Ears: Tympanic membranes intact, normal light reflex, no erythema, no bulging    Eyes: PERRLA, EOMI    Nose: nasal turbinates moist    Throat: moist mucus membranes, no erythema Cardio: regular rate and rhythm, S1S2 heard, no murmurs appreciated Pulm: clear to auscultation bilaterally, no wheezes, rhonchi or rales GI: soft, non-tender, non-distended, bowel sounds present, no hepatomegaly, no splenomegaly GU: external vaginal tissue ***, cervix ***, *** punctate lesions on cervix appreciated, *** discharge from cervical os, *** bleeding, *** cervical motion tenderness, *** abdominal/ adnexal masses Extremities: warm, well perfused, No edema, cyanosis or clubbing;  MSK: Normal gait and station Skin: dry, intact, no rashes or lesions Neuro: Strength and sensation grossly intact   Assessment/Plan: See problem based a/p  No problem-specific Assessment & Plan notes found for this encounter.

## 2017-04-17 ENCOUNTER — Ambulatory Visit: Payer: BLUE CROSS/BLUE SHIELD | Admitting: Internal Medicine

## 2017-04-18 ENCOUNTER — Ambulatory Visit (HOSPITAL_COMMUNITY)
Admission: RE | Admit: 2017-04-18 | Discharge: 2017-04-18 | Disposition: A | Discharge status: Home / Self Care | Payer: BLUE CROSS/BLUE SHIELD | Source: Ambulatory Visit | Attending: Family Medicine | Admitting: Family Medicine

## 2017-04-18 DIAGNOSIS — M542 Cervicalgia: Secondary | ICD-10-CM | POA: Diagnosis not present

## 2017-04-18 DIAGNOSIS — M5412 Radiculopathy, cervical region: Secondary | ICD-10-CM | POA: Insufficient documentation

## 2017-04-21 ENCOUNTER — Telehealth: Payer: Self-pay | Admitting: Internal Medicine

## 2017-04-21 NOTE — Telephone Encounter (Signed)
Called patient to let her know the results of her cervical xrays. Patient to follow up as needed with time.

## 2017-04-22 ENCOUNTER — Telehealth: Payer: Self-pay | Admitting: Family Medicine

## 2017-04-22 DIAGNOSIS — M5412 Radiculopathy, cervical region: Secondary | ICD-10-CM

## 2017-04-22 NOTE — Telephone Encounter (Signed)
Pt called and wanted to see if Dr. Emmaline Life would refer her to get an MRI for whats going on with her numbness. Please advise

## 2017-04-24 NOTE — Telephone Encounter (Signed)
Please let patient know that we can not do an MRI, if there is no improvement in about 1-2 months we can consider doing further imaging.  Katherine Woods

## 2017-04-28 NOTE — Telephone Encounter (Signed)
Pt called again and wants to know if Dr Ardelia Mems can refer her to get an MRI before the end of this year. She says her insurance runs out at the end of the year and if she needs an MRI next year she wont be able to afford it. Pt would like to speak to PCP about this. Please advise

## 2017-04-29 ENCOUNTER — Other Ambulatory Visit: Payer: Self-pay | Admitting: Family Medicine

## 2017-04-30 NOTE — Addendum Note (Signed)
Addended by: Leeanne Rio on: 04/30/2017 02:30 PM   Modules accepted: Orders

## 2017-04-30 NOTE — Telephone Encounter (Signed)
Pt called again regarding the MRI. She wants a referral now since she wont be able to afford it after this year Please advise

## 2017-04-30 NOTE — Telephone Encounter (Signed)
Called patient & spoke with her As she's had symptoms for several months without improvement, reasonable to get MRI.  Order entered.  Red team, please call & schedule her MRI for some time in the next several weeks, & let patient know about appointment.  Thanks Leeanne Rio, MD

## 2017-05-07 NOTE — Telephone Encounter (Signed)
Pt scheduled and informed of her appt. (Wednesday 05/14/17 @ 4pm arrive at 3:45, 1st floor radiology). Hellen Shanley Kennon Holter, CMA

## 2017-05-12 ENCOUNTER — Other Ambulatory Visit: Payer: Self-pay | Admitting: Family Medicine

## 2017-05-12 DIAGNOSIS — Z1231 Encounter for screening mammogram for malignant neoplasm of breast: Secondary | ICD-10-CM

## 2017-05-14 ENCOUNTER — Ambulatory Visit (HOSPITAL_COMMUNITY): Payer: BLUE CROSS/BLUE SHIELD

## 2017-05-17 ENCOUNTER — Ambulatory Visit (HOSPITAL_COMMUNITY)
Admission: RE | Admit: 2017-05-17 | Discharge: 2017-05-17 | Disposition: A | Discharge status: Home / Self Care | Payer: BLUE CROSS/BLUE SHIELD | Source: Ambulatory Visit | Attending: Family Medicine | Admitting: Family Medicine

## 2017-05-17 DIAGNOSIS — M5412 Radiculopathy, cervical region: Secondary | ICD-10-CM | POA: Insufficient documentation

## 2017-05-17 DIAGNOSIS — M4802 Spinal stenosis, cervical region: Secondary | ICD-10-CM | POA: Diagnosis not present

## 2017-05-17 DIAGNOSIS — M50222 Other cervical disc displacement at C5-C6 level: Secondary | ICD-10-CM | POA: Diagnosis not present

## 2017-05-17 DIAGNOSIS — M50223 Other cervical disc displacement at C6-C7 level: Secondary | ICD-10-CM | POA: Diagnosis not present

## 2017-05-22 ENCOUNTER — Telehealth: Payer: Self-pay | Admitting: Family Medicine

## 2017-05-22 DIAGNOSIS — M5412 Radiculopathy, cervical region: Secondary | ICD-10-CM

## 2017-05-22 NOTE — Telephone Encounter (Signed)
Would like mri results from Saturday

## 2017-05-23 NOTE — Telephone Encounter (Signed)
Called patient & reviewed results Has disc bulge in neck. Recommend seeing neurosurgery to discuss options - may be able to get away with conservative tx like steroid injections etc. Patient agreeable. Referral entered. Patient appreciative  Leeanne Rio, MD

## 2017-05-30 DIAGNOSIS — N979 Female infertility, unspecified: Secondary | ICD-10-CM | POA: Diagnosis not present

## 2017-06-11 ENCOUNTER — Ambulatory Visit: Payer: BLUE CROSS/BLUE SHIELD

## 2017-06-13 ENCOUNTER — Ambulatory Visit: Payer: BLUE CROSS/BLUE SHIELD

## 2017-06-16 ENCOUNTER — Telehealth: Payer: Self-pay | Admitting: Family Medicine

## 2017-06-16 NOTE — Telephone Encounter (Signed)
Katherine Woods told pt they have not received the referral. Please re fax the referral so the patient can schedule an appt.

## 2017-06-16 NOTE — Telephone Encounter (Signed)
Katherine Woods, This referral was to neurosurgery. It looks like the referral was put in the neurology workqueue.  I don't think that office has a neurosurgeon. Does it need to be sent somewhere else? Thanks, Leeanne Rio, MD

## 2017-06-24 ENCOUNTER — Telehealth: Payer: Self-pay | Admitting: Family Medicine

## 2017-06-24 NOTE — Telephone Encounter (Signed)
Pt is calling and would like to speak to Dr. Ardelia Mems about some papers she needs to be filled out. She has not brought them here yet , since she needs to speak to the doctor first. Blima Rich

## 2017-06-25 NOTE — Telephone Encounter (Signed)
Called patient. I could not understand exactly what forms she is referring to. It appears to be a work limitation form or possibly FMLA. Encouraged her to drop off the forms so I can take a look at them and I will call her if I have questions about them.  Leeanne Rio, MD

## 2017-06-25 NOTE — Telephone Encounter (Signed)
Pt is wanting the forms to say she can only work  8 hours per shift.  They are not FMLA.  She was asleep when dr Ardelia Mems called and was talking out of her head

## 2017-06-26 NOTE — Telephone Encounter (Signed)
Ok. She'll need to drop off the forms and I'll let her know if I have questions. Leeanne Rio, MD

## 2017-06-27 ENCOUNTER — Telehealth: Payer: Self-pay | Admitting: Family Medicine

## 2017-06-27 NOTE — Telephone Encounter (Signed)
Pregnancy form dropped off for at front desk for completion.  Verified that patient section of form has been completed.  Last DOS/WCC with PCP was 12/27/16.  Placed form in team folder to be completed by clinical staff.  Crista Luria

## 2017-07-02 NOTE — Telephone Encounter (Signed)
Pt needs form back to her employer by 07-08-17. She would like to get the forms back before then to make sure they are correct. Please advise

## 2017-07-02 NOTE — Telephone Encounter (Signed)
Placed in MD's box.Ozella Almond, CMA

## 2017-07-03 DIAGNOSIS — M4722 Other spondylosis with radiculopathy, cervical region: Secondary | ICD-10-CM | POA: Diagnosis not present

## 2017-07-03 NOTE — Telephone Encounter (Signed)
Pt called again about the forms.  She needs them before jan 29

## 2017-07-04 ENCOUNTER — Ambulatory Visit
Admission: RE | Admit: 2017-07-04 | Discharge: 2017-07-04 | Disposition: A | Discharge status: Home / Self Care | Payer: BLUE CROSS/BLUE SHIELD | Source: Ambulatory Visit | Attending: Family Medicine | Admitting: Family Medicine

## 2017-07-04 DIAGNOSIS — Z1231 Encounter for screening mammogram for malignant neoplasm of breast: Secondary | ICD-10-CM

## 2017-07-04 NOTE — Telephone Encounter (Signed)
Forms completed. Will place in RN office. Patient will need to be contacted Monday with forms & copy will need to be scanned into chart.  Leeanne Rio, MD

## 2017-07-07 ENCOUNTER — Other Ambulatory Visit: Payer: Self-pay | Admitting: Family Medicine

## 2017-07-07 DIAGNOSIS — R928 Other abnormal and inconclusive findings on diagnostic imaging of breast: Secondary | ICD-10-CM

## 2017-07-07 NOTE — Telephone Encounter (Signed)
Harriet spoke with patient. She is aware forms are ready. Copy made and placed to scan. Originals at front by [facility] to pick up. Danley Danker, RN [facility] RN)

## 2017-07-11 ENCOUNTER — Ambulatory Visit
Admission: RE | Admit: 2017-07-11 | Discharge: 2017-07-11 | Disposition: A | Discharge status: Home / Self Care | Payer: BLUE CROSS/BLUE SHIELD | Source: Ambulatory Visit | Attending: Family Medicine | Admitting: Family Medicine

## 2017-07-11 ENCOUNTER — Ambulatory Visit: Payer: BLUE CROSS/BLUE SHIELD

## 2017-07-11 DIAGNOSIS — R922 Inconclusive mammogram: Secondary | ICD-10-CM | POA: Diagnosis not present

## 2017-07-11 DIAGNOSIS — R928 Other abnormal and inconclusive findings on diagnostic imaging of breast: Secondary | ICD-10-CM

## 2017-07-14 ENCOUNTER — Telehealth: Payer: Self-pay | Admitting: Family Medicine

## 2017-07-14 NOTE — Telephone Encounter (Signed)
She can eat a diet that is low in fats, especially things like fried foods, chips, etc.  Leeanne Rio, MD

## 2017-07-14 NOTE — Telephone Encounter (Signed)
Pt would like information on how to improve her cholesterol numbers. Please advise

## 2017-07-16 NOTE — Telephone Encounter (Signed)
LMOVM informed pt that she can eat a diet low in fats. [place] Kennon Holter, CMA

## 2017-12-03 ENCOUNTER — Telehealth: Payer: Self-pay | Admitting: Family Medicine

## 2017-12-03 NOTE — Telephone Encounter (Signed)
Dr Ardelia Mems filled out FMLA paper work for the pt and the pt said Dr Ardelia Mems filled out that she "does not have flare ups"  And the pt claims that she does and was wondering if the paper work would be able to be changed?

## 2017-12-05 NOTE — Telephone Encounter (Signed)
Pt was calling to check on the status of Dr. Ardelia Mems possibly redoing her FMLA paper work. Pt has a couple questions and would like Dr. Ardelia Mems to call her at [phone]

## 2017-12-08 NOTE — Telephone Encounter (Signed)
Returned call to patient.  She wants FMLA paperowrk to reflect that she has occasional flareups of arm pain, lasting 1 day at a time, about 3 times a month. This is reasonable. She will drop off new FMLA papers for me to complete and I will update them. Encouraged her to keep her appointment with me which is scheduled in a few weeks. Leeanne Rio, MD

## 2017-12-10 ENCOUNTER — Telehealth: Payer: Self-pay | Admitting: Family Medicine

## 2017-12-10 NOTE — Telephone Encounter (Signed)
FMLA form dropped off for at front desk for completion.  Verified that patient section of form has been completed.  Last DOS/WCC with PCP was 12/27/17.  Placed form in team folder to be completed by clinical staff.  Crista Luria

## 2017-12-10 NOTE — Telephone Encounter (Signed)
Placed in MDs box. Deseree Blount, CMA  

## 2017-12-12 NOTE — Telephone Encounter (Signed)
FMLA paperwork completed. Will place in RN inbox. A copy of papers needs to be scanned into chart. Leeanne Rio, MD

## 2017-12-12 NOTE — Telephone Encounter (Signed)
Patient aware forms are available for pick up at front desk. Copy of forms made and placed in batch scanning. Danley Danker, RN [facility] RN)

## 2017-12-16 ENCOUNTER — Telehealth: Payer: Self-pay | Admitting: Family Medicine

## 2017-12-16 NOTE — Telephone Encounter (Signed)
Pt would like for Dr. Ardelia Mems to call her. She said that her employer has questions about her FMLA paperwork and pt would like to discuss this with her.

## 2017-12-16 NOTE — Telephone Encounter (Signed)
Pt was calling again to see if Dr Ardelia Mems could call her as soon as possible.

## 2017-12-17 NOTE — Telephone Encounter (Signed)
Pt called and wanted Dr. Ardelia Mems know that her FMLA is due to HR on Tuesday and she needs to speak with her concerning some questions she has before the deadline.

## 2017-12-18 ENCOUNTER — Telehealth: Payer: Self-pay | Admitting: Family Medicine

## 2017-12-18 NOTE — Telephone Encounter (Signed)
Returned call to patient. I have been working in the hospital the last 2 days.  She needs the dates of visits she's had updated on the paperwork. I put the dates for when she was seen in our office (which is what the question asked for) but she would like it to also include that she saw the neurosurgeon on 07/03/17 and that she has an upcoming appointment with me.  Advised I can make these adjustments. She will drop off the papers for me to update today, and I will take care of them this PM so she can pick them up tomorrow. Patient appreciative.  Leeanne Rio, MD

## 2017-12-18 NOTE — Telephone Encounter (Signed)
Placed in MDs box. Tobey Schmelzle, CMA  

## 2017-12-18 NOTE — Telephone Encounter (Signed)
Pt informed. Rhian Asebedo, CMA  

## 2017-12-18 NOTE — Telephone Encounter (Signed)
form dropped off for at front desk for completion.  Verified that patient section of form has been completed.  Last DOS/WCC with PCP was 12/27/17.  Placed form in team folder to be completed by clinical staff.  Crista Luria

## 2017-12-18 NOTE — Telephone Encounter (Signed)
Pt's 4th time calling concerning her FMLA paperwork. Pt really needs to speak to Ardelia Mems about his paperwork before next Tuesday. Pt not happy that she hasn't been called back yet. Please call pt back about this.

## 2017-12-18 NOTE — Telephone Encounter (Signed)
FMLA paperwork addended (see separate phone encounter for details). Will scan copy into chart and place original at front desk for patient to pick up. Please let patient know she can come pick it up tomorrow. Leeanne Rio, MD

## 2017-12-19 ENCOUNTER — Telehealth: Payer: Self-pay | Admitting: Family Medicine

## 2017-12-19 NOTE — Telephone Encounter (Signed)
Pt called and would like Dr. Ardelia Mems to call her. She is having issues faxing her FMLA paper work to [place] of her job. She has some questions.

## 2017-12-22 NOTE — Telephone Encounter (Signed)
Returned call to patient. No answer. LVM asking her to call back if she would like. Leeanne Rio, MD

## 2017-12-24 NOTE — Telephone Encounter (Signed)
Pt returning Dr. Ellie Lunch phone call.

## 2017-12-24 NOTE — Telephone Encounter (Signed)
Called & spoke with patient. Evidently her FMLA paperwork was refused. It's not clear to me why this was. She has an upcoming appointment with me at which time we will review it together in person and see if there is something different that needs to be written. Patient appreciative.  Leeanne Rio, MD

## 2017-12-24 NOTE — Telephone Encounter (Signed)
Pt called back. I told her to keep her phone by her.

## 2017-12-24 NOTE — Telephone Encounter (Signed)
Called patient back and got voicemail, left another VM offering for her to call back. Leeanne Rio, MD

## 2017-12-30 ENCOUNTER — Encounter: Payer: Self-pay | Admitting: Family Medicine

## 2017-12-30 ENCOUNTER — Ambulatory Visit (INDEPENDENT_AMBULATORY_CARE_PROVIDER_SITE_OTHER): Payer: BLUE CROSS/BLUE SHIELD | Admitting: Family Medicine

## 2017-12-30 VITALS — BP 105/60 | HR 77 | Temp 98.2°F | Ht 62.0 in | Wt 130.4 lb

## 2017-12-30 DIAGNOSIS — Z3009 Encounter for other general counseling and advice on contraception: Secondary | ICD-10-CM | POA: Diagnosis not present

## 2017-12-30 DIAGNOSIS — M5412 Radiculopathy, cervical region: Secondary | ICD-10-CM | POA: Diagnosis not present

## 2017-12-30 DIAGNOSIS — Z0000 Encounter for general adult medical examination without abnormal findings: Secondary | ICD-10-CM

## 2017-12-30 MED ORDER — NORELGESTROMIN-ETH ESTRADIOL 150-35 MCG/24HR TD PTWK: MEDICATED_PATCH | TRANSDERMAL | 3 refills | Status: DC

## 2017-12-30 MED ORDER — NAPROXEN 500 MG PO TABS: 500.0000 mg | ORAL_TABLET | Freq: Every day | ORAL | 1 refills | Status: DC | PRN

## 2017-12-30 NOTE — Patient Instructions (Signed)

## 2017-12-30 NOTE — Progress Notes (Signed)
Date of Visit: 12/30/2017   HPI:  Patient presents today for a well woman exam.   Concerns today: none, doing well. Just needs birth control refilled. Periods: monthly periods Contraception: likes patches Pelvic symptoms: no vaginal discharge or pelvic pain Sexual activity: no STD Screening: declines today Pap smear status: UTD Exercise: active at her job Diet: eats lots of fast food, counseled to limit this Smoking: no Alcohol: occasional Drugs: no Mood: not a concern Dentist: has one  FMLA paperwork for her cervical radiculopathy was accepted. Requests rx for naproxen which helps during flareups. Doing well overall, just has flareups occasionally which require her to miss work. Can only work 8 hour days at a time.  ROS: See HPI  [place]:  Cancers in family: no cancers in family  PHYSICAL EXAM: BP 105/60 (BP Location: Left Arm, Patient Position: Sitting, Cuff Size: Normal)   Pulse 77   Temp 98.2 F (36.8 C) (Oral)   Ht 5\' 2"  (1.575 m)   Wt 130 lb 6.4 oz (59.1 kg)   LMP 12/02/2017 (Approximate)   SpO2 100%   BMI 23.85 kg/m  Gen: NAD, pleasant, cooperative HEENT: NCAT, PERRL, no palpable thyromegaly or anterior cervical lymphadenopathy. Full ROM of neck. Heart: RRR, no murmurs Lungs: CTAB, NWOB Abdomen: soft, nontender to palpation Neuro: grossly nonfocal, speech normal Extremities: full strenght bilateral upper extremities. Full ROM of shoulders bilaterally.  ASSESSMENT/PLAN:  Health maintenance:  -STD screening: declines -pap smear: UTD -mammogram: UTD -lipid screening: declines to update lipids today -immunizations: UTD -handout given on health maintenance topics  Cervical radiculopathy Doing well overall but still needs to miss work occasionally for flares. Fortunately FMLA paperwork was accepted, will need to be renewed in January. Rx for naproxen for occasional use during flares.  Contraception management Refill ortho evra patches.   FOLLOW  UP: Follow up in 6 mos for radiculopathy  [place] J. Ardelia Mems, [place]

## 2018-01-05 NOTE — Assessment & Plan Note (Signed)
Doing well overall but still needs to miss work occasionally for flares. Fortunately FMLA paperwork was accepted, will need to be renewed in January. Rx for naproxen for occasional use during flares.

## 2018-01-05 NOTE — Assessment & Plan Note (Signed)
Refill ortho evra patches.

## 2018-01-09 ENCOUNTER — Other Ambulatory Visit: Payer: Self-pay | Admitting: Family Medicine

## 2018-05-22 ENCOUNTER — Other Ambulatory Visit: Payer: Self-pay

## 2018-05-22 ENCOUNTER — Telehealth: Payer: Self-pay | Admitting: Family Medicine

## 2018-05-22 ENCOUNTER — Encounter: Payer: Self-pay | Admitting: Family Medicine

## 2018-05-22 ENCOUNTER — Ambulatory Visit (INDEPENDENT_AMBULATORY_CARE_PROVIDER_SITE_OTHER): Payer: BLUE CROSS/BLUE SHIELD | Admitting: Family Medicine

## 2018-05-22 VITALS — BP 104/62 | HR 84 | Temp 98.2°F | Wt 136.8 lb

## 2018-05-22 DIAGNOSIS — R0789 Other chest pain: Secondary | ICD-10-CM

## 2018-05-22 MED ORDER — BACLOFEN 10 MG PO TABS
10.0000 mg | ORAL_TABLET | Freq: Every day | ORAL | 1 refills | Status: DC | PRN
Start: 2018-05-22 — End: 2018-05-25

## 2018-05-22 NOTE — Telephone Encounter (Signed)
Pt seen 12-13 and thought Katherine Woods was giving her a higher dosage of Baclofen. [place] sent in same dosage but to take 1 whole pill a day now because she was supposed to be taking 1/2 pill for the start. Pt said she thought she was supposed to be taking the whole pill from the start and that has not helped her. Pt requesting a different medication since 10 MG is the highest dosage per Deseree. Please call pt back to discuss other options.

## 2018-05-22 NOTE — Patient Instructions (Signed)
Sent in higher dose of baclofen Follow up in January as planned  Be well, Dr. Ardelia Mems

## 2018-05-22 NOTE — Progress Notes (Signed)
Date of Visit: 05/22/2018   HPI:  Patient presents to discuss musculoskeletal pain in chest.  Has pain in center of chest sometimes when she reaches above her head at work. Gets sore and she takes naproxen with some improvement. When pain occurs the chest wall is tender to palpation. Previously I prescribed baclofen 5mg  tabs for her to take, which she says did not help. Wants to try a higher dose of this medication. Otherwise she is doing well and has no complaints. Wants to ensure her mammogram was normal from earlier this year.  ROS: See HPI.  [place]: history of prior TBI, costochondritis  PHYSICAL EXAM: BP 104/62   Pulse 84   Temp 98.2 F (36.8 C) (Oral)   Wt 136 lb 12.8 oz (62.1 kg)   SpO2 99%   BMI 25.02 kg/m  Gen: no acute distress, pleasant, cooeprative HEENT: normocephalic, atraumatic, moist mucous membranes  Chest wall: nontender to palpation  Heart: regular rate and rhythm, no murmur Lungs: clear to auscultation bilaterally, normal work of breathing  Neuro: alert grossly nonfocal, speech normal Ext: full ROM of shoulders  ASSESSMENT/PLAN:  Health maintenance:  -Offered flu shot to patient today, but pt declined.  -otherwise UTD on HM items  Costochondritis Reassured patient of normal mammogram result from earlier this year (she did have to have follow up imaging which was benign). Trial of higher dose baclofen 10mg  per dose.  FOLLOW UP: Follow up in January to renew FMLA papers  [place] J. Ardelia Mems, [place]

## 2018-05-25 MED ORDER — CYCLOBENZAPRINE HCL 10 MG PO TABS: 5.0000 mg | ORAL_TABLET | Freq: Every day | ORAL | 0 refills | Status: DC | PRN

## 2018-05-25 NOTE — Telephone Encounter (Signed)
Called & spoke with patient. I was not aware she had been taking a full pill of baclofen. We will stop that. New rx sent in for flexeril. Advised to break in half and take 5mg  daily as needed, can go up to a full pill if needed. Patient appreciative.  Leeanne Rio, MD

## 2018-06-15 ENCOUNTER — Ambulatory Visit (INDEPENDENT_AMBULATORY_CARE_PROVIDER_SITE_OTHER): Payer: 59 | Admitting: Family Medicine

## 2018-06-15 ENCOUNTER — Encounter: Payer: Self-pay | Admitting: Family Medicine

## 2018-06-15 DIAGNOSIS — M5412 Radiculopathy, cervical region: Secondary | ICD-10-CM

## 2018-06-15 NOTE — Assessment & Plan Note (Signed)
Stable overall but anticipate may worsen when she moves to a more intensive department at work. FMLA paperwork completed today authorizing flareups up to 4 times a month, missing work for 2 days at a time if needed. Continue naproxen as needed for pain.

## 2018-06-15 NOTE — Patient Instructions (Signed)
Follow up in 6 months for physical, sooner if needed  Be well, Dr. Ardelia Mems

## 2018-06-15 NOTE — Progress Notes (Signed)
Date of Visit: 06/15/2018   HPI:  Patient presents today to follow up on her L shoulder/neck pain.  Overall this is doing well and is stable. 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 Gets pain in L shoulder/neck after frequent and it improves with naproxen and rest. Needs to take a day off work sometimes when pain flares up particularly bad. Her job is going to be moving her to another dept for some of the time she is at work, and she anticipates it is going to be more sore as a result of working in that other department. Thinks it is possible she may have a flareup more often or may need an extra day to rest during the flareup.  ROS: See HPI.  [place]: history of TBI, ovarian cyst  PHYSICAL EXAM: BP 100/60   Pulse 81   Temp 98.2 F (36.8 C) (Oral)   Wt 138 lb 12.8 oz (63 kg)   LMP 06/10/2018   SpO2 98%   BMI 25.39 kg/m  Gen: no acute distress, pleasant, cooperative Extremities: full strength bilateral upper extremities with grip, shoulder abduction and adduction, elbow extension & flexion. ROM not as brisk with L shoudler compared to R, but is generally intact.  ASSESSMENT/PLAN:  Health maintenance:  -UTD on HM items  Cervical radiculopathy Stable overall but anticipate may worsen when she moves to a more intensive department at work. FMLA paperwork completed today authorizing flareups up to 4 times a month, missing work for 2 days at a time if needed. Continue naproxen as needed for pain.  FOLLOW UP: Follow up in 6 months for routine CPE  [place] J. Ardelia Mems, [place]

## 2018-11-05 ENCOUNTER — Encounter: Payer: Self-pay | Admitting: Family Medicine

## 2018-11-05 ENCOUNTER — Ambulatory Visit (INDEPENDENT_AMBULATORY_CARE_PROVIDER_SITE_OTHER): Payer: 59 | Admitting: Family Medicine

## 2018-11-05 ENCOUNTER — Other Ambulatory Visit: Payer: Self-pay

## 2018-11-05 VITALS — BP 100/60 | HR 78 | Ht 62.0 in | Wt 134.5 lb

## 2018-11-05 DIAGNOSIS — J309 Allergic rhinitis, unspecified: Secondary | ICD-10-CM | POA: Diagnosis not present

## 2018-11-05 DIAGNOSIS — H6123 Impacted cerumen, bilateral: Secondary | ICD-10-CM | POA: Insufficient documentation

## 2018-11-05 DIAGNOSIS — H6121 Impacted cerumen, right ear: Secondary | ICD-10-CM | POA: Diagnosis not present

## 2018-11-05 DIAGNOSIS — H1013 Acute atopic conjunctivitis, bilateral: Secondary | ICD-10-CM

## 2018-11-05 MED ORDER — FLUTICASONE PROPIONATE 50 MCG/ACT NA SUSP: 2.0000 | Freq: Every day | NASAL | 6 refills | Status: DC

## 2018-11-05 MED ORDER — CETIRIZINE HCL 10 MG PO CHEW: 10.0000 mg | CHEWABLE_TABLET | Freq: Every day | ORAL | 1 refills | Status: DC

## 2018-11-05 NOTE — Patient Instructions (Signed)
Earwax Buildup, Adult  The ears produce a substance called earwax that helps keep bacteria out of the ear and protects the skin in the ear canal. Occasionally, earwax can build up in the ear and cause discomfort or hearing loss.  What increases the risk?  This condition is more likely to develop in people who:  · Are female.  · Are elderly.  · Naturally produce more earwax.  · Clean their ears often with cotton swabs.  · Use earplugs often.  · Use in-ear headphones often.  · Wear hearing aids.  · Have narrow ear canals.  · Have earwax that is overly thick or sticky.  · Have eczema.  · Are dehydrated.  · Have excess hair in the ear canal.  What are the signs or symptoms?  Symptoms of this condition include:  · Reduced or muffled hearing.  · A feeling of fullness in the ear or feeling that the ear is plugged.  · Fluid coming from the ear.  · Ear pain.  · Ear itch.  · Ringing in the ear.  · Coughing.  · An obvious piece of earwax that can be seen inside the ear canal.  How is this diagnosed?  This condition may be diagnosed based on:  · Your symptoms.  · Your medical history.  · An ear exam. During the exam, your health care provider will look into your ear with an instrument called an otoscope.  You may have tests, including a hearing test.  How is this treated?  This condition may be treated by:  · Using ear drops to soften the earwax.  · Having the earwax removed by a health care provider. The health care provider may:  ? Flush the ear with water.  ? Use an instrument that has a loop on the end (curette).  ? Use a suction device.  · Surgery to remove the wax buildup. This may be done in severe cases.  Follow these instructions at home:    · Take over-the-counter and prescription medicines only as told by your health care provider.  · Do not put any objects, including cotton swabs, into your ear. You can clean the opening of your ear canal with a washcloth or facial tissue.  · Follow instructions from your health care  provider about cleaning your ears. Do not over-clean your ears.  · Drink enough fluid to keep your urine clear or pale yellow. This will help to thin the earwax.  · Keep all follow-up visits as told by your health care provider. If earwax builds up in your ears often or if you use hearing aids, consider seeing your health care provider for routine, preventive ear cleanings. Ask your health care provider how often you should schedule your cleanings.  · If you have hearing aids, clean them according to instructions from the manufacturer and your health care provider.  Contact a health care provider if:  · You have ear pain.  · You develop a fever.  · You have blood, pus, or other fluid coming from your ear.  · You have hearing loss.  · You have ringing in your ears that does not go away.  · Your symptoms do not improve with treatment.  · You feel like the room is spinning (vertigo).  Summary  · Earwax can build up in the ear and cause discomfort or hearing loss.  · The most common symptoms of this condition include reduced or muffled hearing and a feeling of   fullness in the ear or feeling that the ear is plugged.  · This condition may be diagnosed based on your symptoms, your medical history, and an ear exam.  · This condition may be treated by using ear drops to soften the earwax or by having the earwax removed by a health care provider.  · Do not put any objects, including cotton swabs, into your ear. You can clean the opening of your ear canal with a washcloth or facial tissue.  This information is not intended to replace advice given to you by your health care provider. Make sure you discuss any questions you have with your health care provider.  Document Released: 07/04/2004 Document Revised: 05/08/2017 Document Reviewed: 08/07/2016  Elsevier Interactive Patient Education © 2019 Elsevier Inc.

## 2018-11-05 NOTE — Assessment & Plan Note (Addendum)
Patient with a history of allergic rhinitis previously on Claritin and cromolyn drops but has not used in several years.  Patient continued to endorse symptoms of allergic rhinitis with stuffy nose itchy teary eyes. Discussed stopping Afrin due to rhinitis medicamentosa. Patient verbalized understanding. --We will prescribe Zyrtec 10 mg nightly --Flonase in the morning daily, follow-up as needed
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
## 2018-11-05 NOTE — Assessment & Plan Note (Addendum)
Bilateral cerumen impaction, likely contributing to decreased hearing reported by patient in addition to congestion from uncontrolled allergic rhinitis.  Disimpaction performed today in clinic.  Will recommend Debrox.

## 2018-11-27 ENCOUNTER — Other Ambulatory Visit: Payer: Self-pay | Admitting: Family Medicine

## 2018-11-27 DIAGNOSIS — H1013 Acute atopic conjunctivitis, bilateral: Secondary | ICD-10-CM

## 2018-12-08 IMAGING — MG 2D DIGITAL SCREENING BILATERAL MAMMOGRAM WITH 3D TOMO WITH CAD
9 of 12 series · 9 of 28 positions shown · non-contrast
Comparison: Previous exam(s).

CLINICAL DATA: Screening.

EXAM:
2D DIGITAL SCREENING BILATERAL MAMMOGRAM WITH 3D TOMO WITH CAD

[L CC]
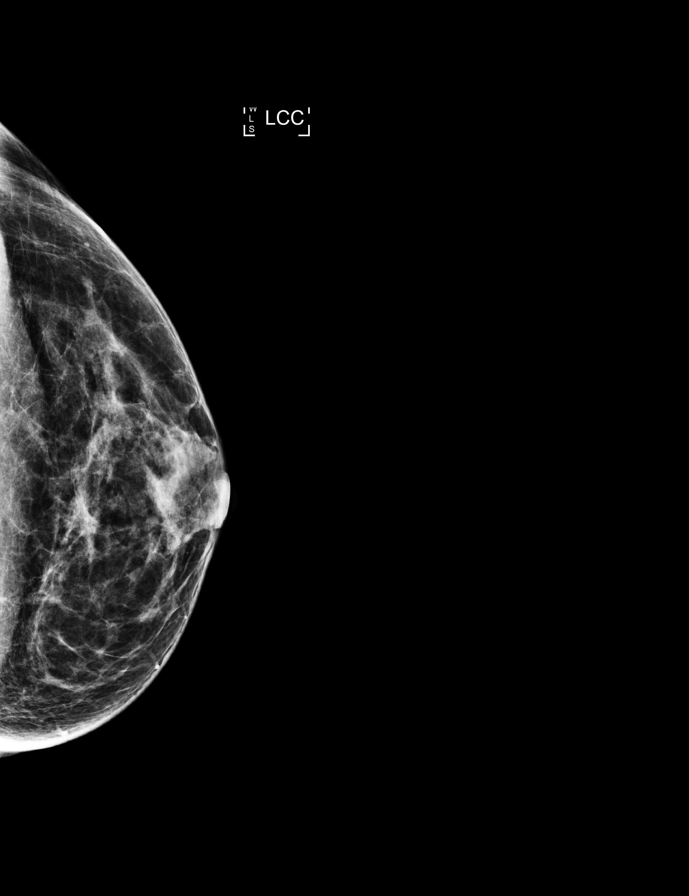

[R MLO]
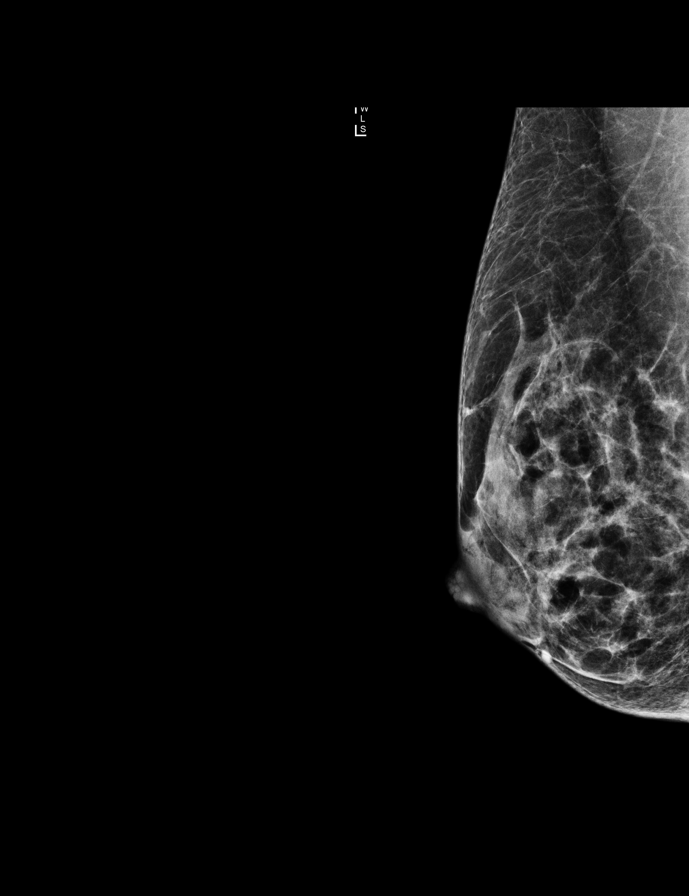

[L CC synth-2D]
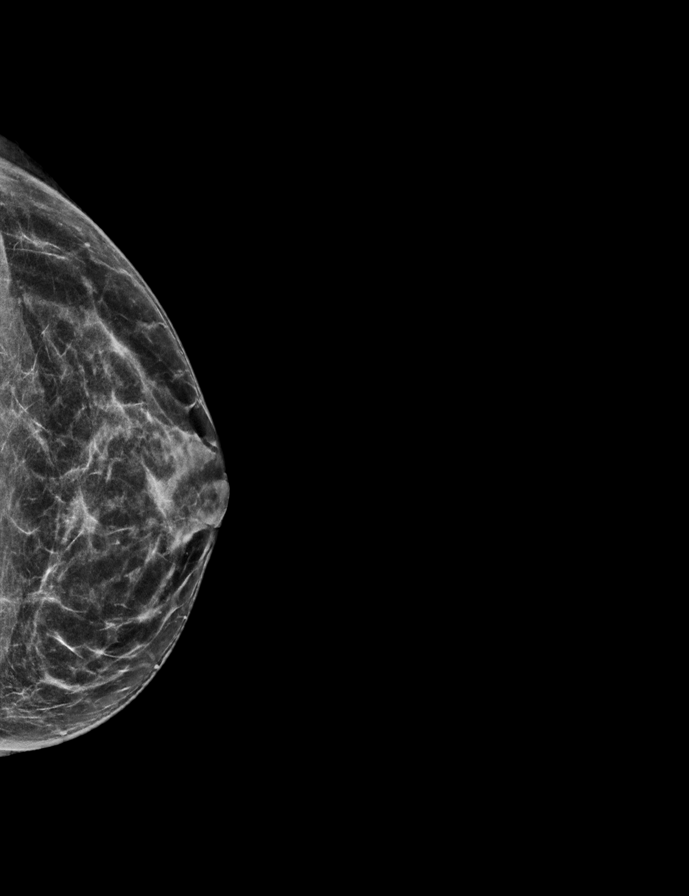

[R MLO synth-2D]
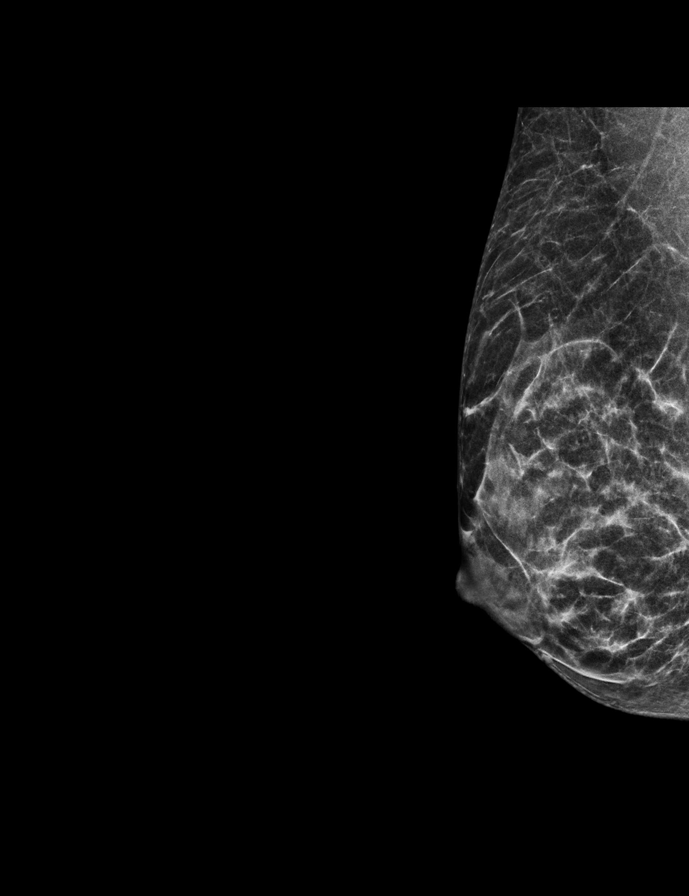

[L MLO synth-2D]
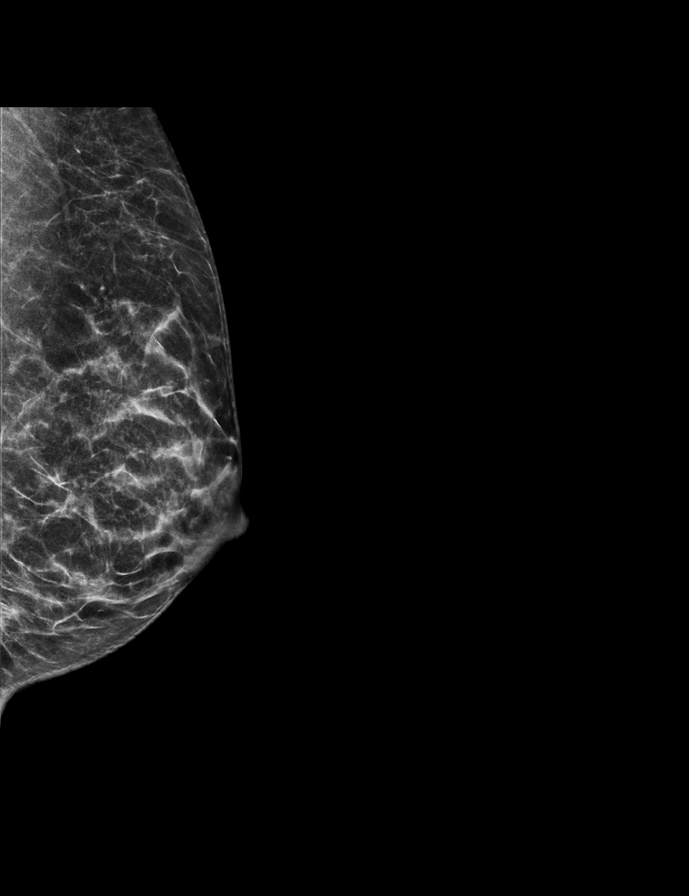

[L MLO]
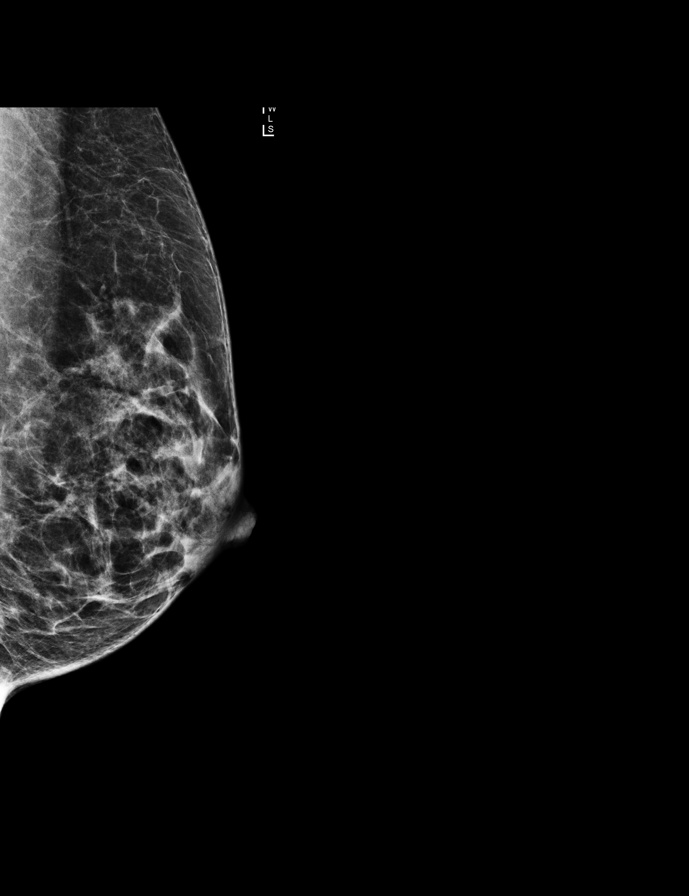

[R CC synth-2D]
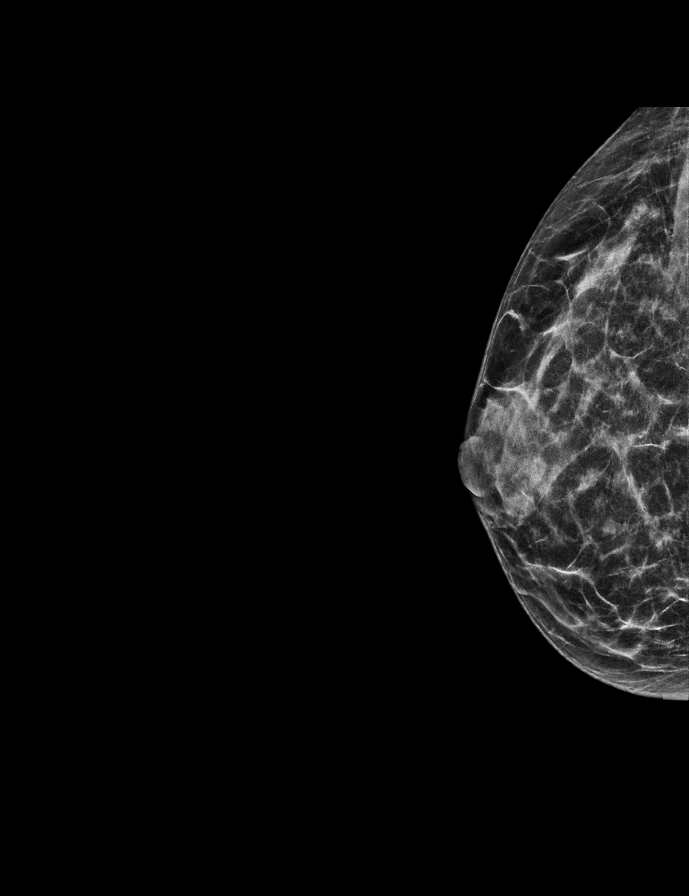

[R CC]
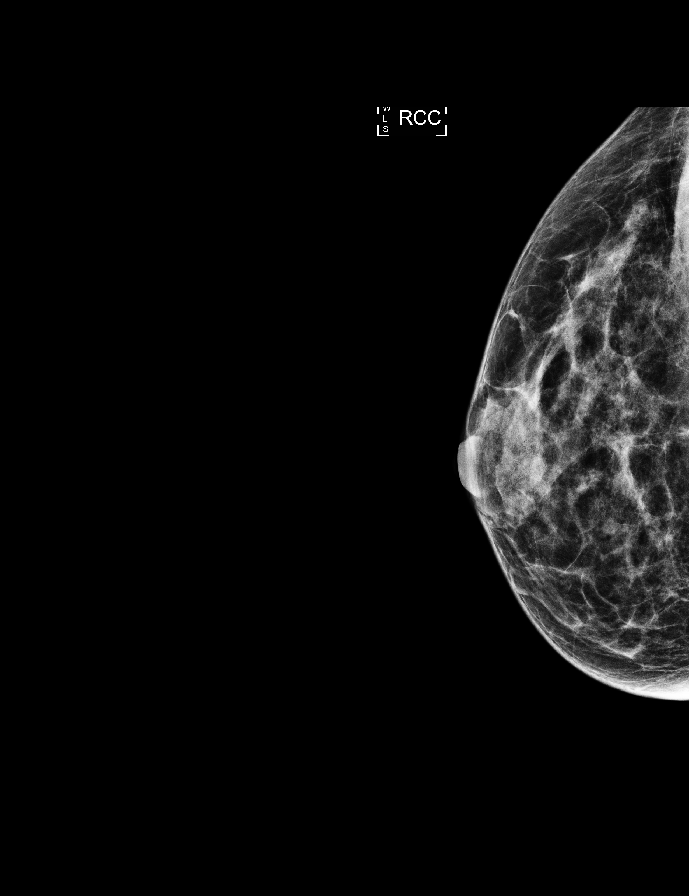

[L MLO tomo · tomo slice 23/46.0]
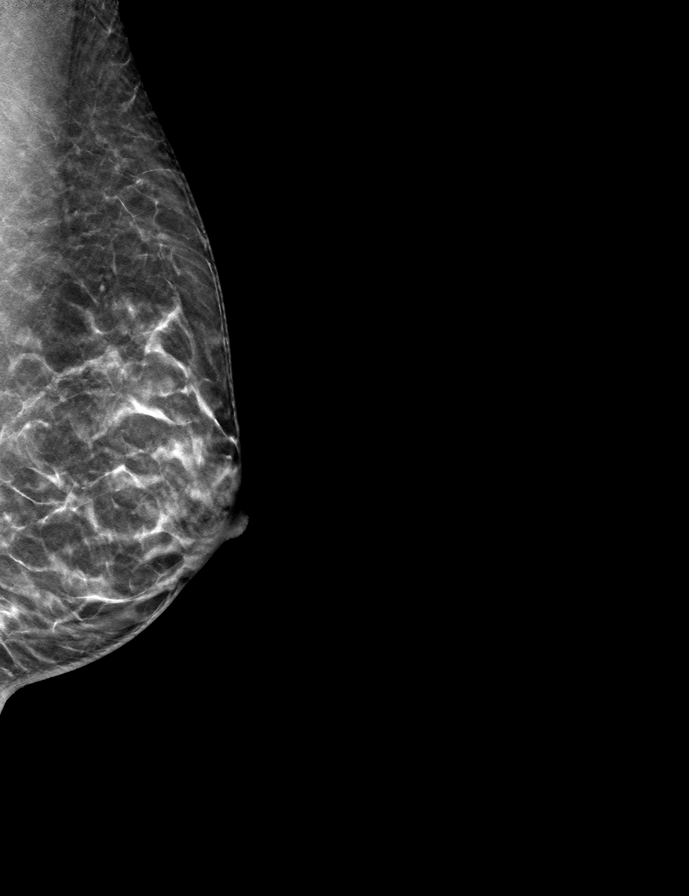

[9 of 28 positions shown; findings below may reference images not displayed]

ACR Breast Density Category b: There are scattered areas of
fibroglandular density.
FINDINGS: In the left breast, a possible asymmetry warrants further
evaluation. In the right breast, no findings suspicious for
malignancy. Images were processed with CAD.
IMPRESSION: Further evaluation is suggested for possible asymmetry in the left
breast.

RECOMMENDATION:
Diagnostic mammogram and possibly ultrasound of the left breast.
(Code:[id number])

The patient will be contacted regarding the findings, and additional
imaging will be scheduled.

BI-RADS CATEGORY  0: Incomplete. Need additional imaging evaluation
and/or prior mammograms for comparison.

## 2018-12-17 ENCOUNTER — Other Ambulatory Visit: Payer: Self-pay | Admitting: Family Medicine

## 2019-01-04 ENCOUNTER — Ambulatory Visit (INDEPENDENT_AMBULATORY_CARE_PROVIDER_SITE_OTHER): Payer: 59 | Admitting: Family Medicine

## 2019-01-04 ENCOUNTER — Other Ambulatory Visit: Payer: Self-pay

## 2019-01-04 ENCOUNTER — Other Ambulatory Visit (HOSPITAL_COMMUNITY)
Admission: RE | Admit: 2019-01-04 | Discharge: 2019-01-04 | Disposition: A | Discharge status: Home / Self Care | Payer: 59 | Source: Ambulatory Visit | Attending: Family Medicine | Admitting: Family Medicine

## 2019-01-04 ENCOUNTER — Encounter: Payer: Self-pay | Admitting: Family Medicine

## 2019-01-04 VITALS — BP 100/70 | HR 75 | Ht 62.0 in | Wt 133.4 lb

## 2019-01-04 DIAGNOSIS — Z0000 Encounter for general adult medical examination without abnormal findings: Secondary | ICD-10-CM | POA: Insufficient documentation

## 2019-01-04 NOTE — Patient Instructions (Signed)
See the handout on how to schedule your mammogram. This is an important test to screen for breast cancer.  Checking labs today Will contact you when form is filled out  Be well, Dr. Ardelia Mems    Health Maintenance, Female Adopting a healthy lifestyle and getting preventive care are important in promoting health and wellness. Ask your health care provider about:  The right schedule for you to have regular tests and exams.  Things you can do on your own to prevent diseases and keep yourself healthy. What should I know about diet, weight, and exercise? Eat a healthy diet   Eat a diet that includes plenty of vegetables, fruits, low-fat dairy products, and lean protein.  Do not eat a lot of foods that are high in solid fats, added sugars, or sodium. Maintain a healthy weight Body mass index (BMI) is used to identify weight problems. It estimates body fat based on height and weight. Your health care provider can help determine your BMI and help you achieve or maintain a healthy weight. Get regular exercise Get regular exercise. This is one of the most important things you can do for your health. Most adults should:  Exercise for at least 150 minutes each week. The exercise should increase your heart rate and make you sweat (moderate-intensity exercise).  Do strengthening exercises at least twice a week. This is in addition to the moderate-intensity exercise.  Spend less time sitting. Even light physical activity can be beneficial. Watch cholesterol and blood lipids Have your blood tested for lipids and cholesterol at 43 years of age, then have this test every 5 years. Have your cholesterol levels checked more often if:  Your lipid or cholesterol levels are high.  You are older than 43 years of age.  You are at high risk for heart disease. What should I know about cancer screening? Depending on your health history and family history, you may need to have cancer screening at various  ages. This may include screening for:  Breast cancer.  Cervical cancer.  Colorectal cancer.  Skin cancer.  Lung cancer. What should I know about heart disease, diabetes, and high blood pressure? Blood pressure and heart disease  High blood pressure causes heart disease and increases the risk of stroke. This is more likely to develop in people who have high blood pressure readings, are of African descent, or are overweight.  Have your blood pressure checked: ? Every 3-5 years if you are 47-69 years of age. ? Every year if you are 107 years old or older. Diabetes Have regular diabetes screenings. This checks your fasting blood sugar level. Have the screening done:  Once every three years after age 63 if you are at a normal weight and have a low risk for diabetes.  More often and at a younger age if you are overweight or have a high risk for diabetes. What should I know about preventing infection? Hepatitis B If you have a higher risk for hepatitis B, you should be screened for this virus. Talk with your health care provider to find out if you are at risk for hepatitis B infection. Hepatitis C Testing is recommended for:  Everyone born from [birth year] through 1965.  Anyone with known risk factors for hepatitis C. Sexually transmitted infections (STIs)  Get screened for STIs, including gonorrhea and chlamydia, if: ? You are sexually active and are younger than 43 years of age. ? You are older than 43 years of age and your health care provider  tells you that you are at risk for this type of infection. ? Your sexual activity has changed since you were last screened, and you are at increased risk for chlamydia or gonorrhea. Ask your health care provider if you are at risk.  Ask your health care provider about whether you are at high risk for HIV. Your health care provider may recommend a prescription medicine to help prevent HIV infection. If you choose to take medicine to prevent HIV, you  should first get tested for HIV. You should then be tested every 3 months for as long as you are taking the medicine. Pregnancy  If you are about to stop having your period (premenopausal) and you may become pregnant, seek counseling before you get pregnant.  Take 400 to 800 micrograms (mcg) of folic acid every day if you become pregnant.  Ask for birth control (contraception) if you want to prevent pregnancy. Osteoporosis and menopause Osteoporosis is a disease in which the bones lose minerals and strength with aging. This can result in bone fractures. If you are 53 years old or older, or if you are at risk for osteoporosis and fractures, ask your health care provider if you should:  Be screened for bone loss.  Take a calcium or vitamin D supplement to lower your risk of fractures.  Be given hormone replacement therapy (HRT) to treat symptoms of menopause. Follow these instructions at home: Lifestyle  Do not use any products that contain nicotine or tobacco, such as cigarettes, e-cigarettes, and chewing tobacco. If you need help quitting, ask your health care provider.  Do not use street drugs.  Do not share needles.  Ask your health care provider for help if you need support or information about quitting drugs. Alcohol use  Do not drink alcohol if: ? Your health care provider tells you not to drink. ? You are pregnant, may be pregnant, or are planning to become pregnant.  If you drink alcohol: ? Limit how much you use to 0-1 drink a day. ? Limit intake if you are breastfeeding.  Be aware of how much alcohol is in your drink. In the [place] one drink equals one 12 oz bottle of beer (355 mL), one 5 oz glass of wine (148 mL), or one 1 oz glass of hard liquor (44 mL). General instructions  Schedule regular health, dental, and eye exams.  Stay current with your vaccines.  Tell your health care provider if: ? You often feel depressed. ? You have ever been abused or do not feel  safe at home. Summary  Adopting a healthy lifestyle and getting preventive care are important in promoting health and wellness.  Follow your health care provider's instructions about healthy diet, exercising, and getting tested or screened for diseases.  Follow your health care provider's instructions on monitoring your cholesterol and blood pressure. This information is not intended to replace advice given to you by your health care provider. Make sure you discuss any questions you have with your health care provider. Document Released: 12/10/2010 Document Revised: 05/20/2018 Document Reviewed: 05/20/2018 Elsevier Patient Education  2020 [employer].

## 2019-01-04 NOTE — Progress Notes (Signed)
Date of Visit: 01/04/2019   HPI:  Patient presents today for a well woman exam.   Concerns today: none, needs labs for work Periods: monthly, no issues Contraception: birth control patch Pelvic symptoms: no vaginal discharge or pelvic pain Sexual activity: not currently STD Screening: declines today Pap smear status: last pap in 2017 normal cytology & neg HPV, patient wants another pap today Exercise: no Smoking: no Alcohol: no Drugs: no Mood: good in general Dentist: yes gets routine dental care  ROS: See HPI  [place]:  Cancers in family: no specific cancers that she knows of  PHYSICAL EXAM: BP 100/70   Pulse 75   Ht 5\' 2"  (1.575 m)   Wt 133 lb 6.4 oz (60.5 kg)   LMP 12/11/2018 (Approximate)   SpO2 98%   BMI 24.40 kg/m  Gen: NAD, pleasant, cooperative HEENT: NCAT, PERRL, no palpable thyromegaly or anterior cervical lymphadenopathy Heart: RRR, no murmurs Lungs: CTAB, NWOB Abdomen: soft, nontender to palpation Neuro: grossly nonfocal, speech normal GU: normal appearing external genitalia without lesions. Vagina is moist with white discharge. Cervix normal in appearance. No cervical motion tenderness or tenderness on bimanual exam. No adnexal masses.   ASSESSMENT/PLAN:  Health maintenance:  -STD screening: declined today -pap smear: done today by patient preference -mammogram: advised to get mammogram, handout given -lipid screening: check lipids today (fasting) -immunizations: UTD -handout given on health maintenance topics  FOLLOW UP: Follow up in 1 year for next CPE  [place] J. Ardelia Mems, [place]

## 2019-01-05 LAB — CMP14+EGFR
ALT: 8 IU/L (ref 0–32)
AST: 18 IU/L (ref 0–40)
Albumin/Globulin Ratio: 1.6 (ref 1.2–2.2)
Albumin: 4.2 g/dL (ref 3.8–4.8)
Alkaline Phosphatase: 62 IU/L (ref 39–117)
BUN/Creatinine Ratio: 11 (ref 9–23)
BUN: 9 mg/dL (ref 6–24)
Bilirubin Total: 0.5 mg/dL (ref 0.0–1.2)
CO2: 23 mmol/L (ref 20–29)
Calcium: 9.1 mg/dL (ref 8.7–10.2)
Chloride: 100 mmol/L (ref 96–106)
Creatinine, Ser: 0.79 mg/dL (ref 0.57–1.00)
GFR calc Af Amer: 107 mL/min/{1.73_m2} (ref >59)
GFR calc non Af Amer: 93 mL/min/{1.73_m2} (ref >59)
Globulin, Total: 2.7 g/dL (ref 1.5–4.5)
Glucose: 85 mg/dL (ref 65–99)
Potassium: 4.8 mmol/L (ref 3.5–5.2)
Sodium: 137 mmol/L (ref 134–144)
Total Protein: 6.9 g/dL (ref 6.0–8.5)

## 2019-01-05 LAB — LIPID PANEL
Chol/HDL Ratio: 3.2 ratio (ref 0.0–4.4)
Cholesterol, Total: 264 mg/dL — ABNORMAL HIGH (ref 100–199)
HDL: 83 mg/dL (ref >39)
LDL Calculated: 169 mg/dL — ABNORMAL HIGH (ref 0–99)
Triglycerides: 62 mg/dL (ref 0–149)
VLDL Cholesterol Cal: 12 mg/dL (ref 5–40)

## 2019-01-05 LAB — HEMOGLOBIN A1C
Est. average glucose Bld gHb Est-mCnc: 108 mg/dL
Hgb A1c MFr Bld: 5.4 % (ref 4.8–5.6)

## 2019-01-06 LAB — CYTOLOGY - PAP
Diagnosis: NEGATIVE
HPV: NOT DETECTED

## 2019-01-11 ENCOUNTER — Telehealth: Payer: Self-pay | Admitting: Family Medicine

## 2019-01-11 ENCOUNTER — Encounter: Payer: Self-pay | Admitting: Family Medicine

## 2019-01-11 NOTE — Telephone Encounter (Signed)
Pt is calling to check on the status of the form she left with Dr. Ardelia Mems to be filled out at her last appointment. She said it was a form that needed to include her lab results for her insurance company. The fax number and information is on the form.   Pt would like it faxed but she would also like to have a copy left at the front desk. Please call pt when form is completed and ready to be picked up.

## 2019-01-11 NOTE — Telephone Encounter (Signed)
Form completed. Will return to [facility] RN team. Patient wants to pick up the original form. A copy should be faxed to [phone]  Thanks Leeanne Rio, MD

## 2019-01-12 NOTE — Telephone Encounter (Signed)
Pt informed that original was up front for pick up.   Copy made for batch scanning.  Copy placed in to be faxed pile. Christen Bame, CMA

## 2019-01-25 ENCOUNTER — Other Ambulatory Visit: Payer: Self-pay | Admitting: Family Medicine

## 2019-02-02 ENCOUNTER — Telehealth: Payer: Self-pay | Admitting: Family Medicine

## 2019-02-02 NOTE — Telephone Encounter (Signed)
Needs to ask you a question regarding how much she can lift at her job, from/for her 'accomodation' form.  Best number to call is [phone].

## 2019-02-03 NOTE — Telephone Encounter (Signed)
Called patient & discussed. She is not sure yet if she will need a new letter written, but if she does it would likely need to increase the amount she can lift at work to 20lb (she feels she can do this safely). She would also want the info about the TBI taken out of the letter, just would want it to list her restrictions. Can reference letter dated February 2017 in writing new letter if needed.  Will wait to hear back from patient whether she needs this or not. Patient appreciative.  Leeanne Rio, MD

## 2019-02-05 NOTE — Telephone Encounter (Signed)
Pt calling wanting to speak to Dr. Ardelia Mems. Pt wanted an appt with her today (02/05/2019) but told pt Ardelia Mems not in office today and her first available appt is 02/22/2019. Pt needs her FMLA paperwork filled out again so I told patient she could drop off paperwork at our office and Ardelia Mems could complete it and call her when it's ready to be picked back up. Pt would just like Ardelia Mems to give her a call. Please advise

## 2019-02-05 NOTE — Telephone Encounter (Signed)
Okay, noted. Thanks Leeanne Rio, MD

## 2019-02-05 NOTE — Telephone Encounter (Signed)
Pt is calling back to let Dr. Ardelia Mems know not to worry about calling her about this paperwork. She has decided to keep it the way it is for now.

## 2019-06-15 ENCOUNTER — Other Ambulatory Visit: Payer: Self-pay

## 2019-06-15 ENCOUNTER — Ambulatory Visit (INDEPENDENT_AMBULATORY_CARE_PROVIDER_SITE_OTHER): Payer: 59 | Admitting: Family Medicine

## 2019-06-15 VITALS — BP 110/80 | HR 87 | Wt 137.8 lb

## 2019-06-15 DIAGNOSIS — M5412 Radiculopathy, cervical region: Secondary | ICD-10-CM

## 2019-06-15 NOTE — Patient Instructions (Signed)
Completed FMLA paperwork today Follow up for regular physical during the summer Call with any questions or concerns  Be well, Dr. Ardelia Mems
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
## 2019-06-29 ENCOUNTER — Telehealth: Payer: Self-pay | Admitting: Family Medicine

## 2019-06-29 NOTE — Telephone Encounter (Signed)
Pt would like for Dr. Ardelia Mems to call her to discuss having an updated accomodation form filled out for her employer.   The best call back number is [phone].

## 2019-06-30 NOTE — Telephone Encounter (Signed)
Pt is calling back and asking Dr. Ardelia Mems to call her back as soon as possible. Pt states that it is very important she speak with her about these forms.

## 2019-07-01 NOTE — Telephone Encounter (Signed)
Spoke with patient. Needs letter saying she can't meet standard productivity quotas. Wrote letter, placed at front desk. Leeanne Rio, MD

## 2019-12-07 ENCOUNTER — Other Ambulatory Visit: Payer: Self-pay | Admitting: Family Medicine

## 2019-12-17 ENCOUNTER — Other Ambulatory Visit: Payer: Self-pay

## 2019-12-17 DIAGNOSIS — J309 Allergic rhinitis, unspecified: Secondary | ICD-10-CM

## 2019-12-17 MED ORDER — FLUTICASONE PROPIONATE 50 MCG/ACT NA SUSP: 2.0000 | Freq: Every day | NASAL | 2 refills | Status: DC

## 2020-01-06 ENCOUNTER — Encounter: Payer: 59 | Admitting: Family Medicine

## 2020-02-01 ENCOUNTER — Other Ambulatory Visit: Payer: Self-pay

## 2020-02-01 ENCOUNTER — Encounter: Payer: Self-pay | Admitting: Family Medicine

## 2020-02-01 ENCOUNTER — Ambulatory Visit (INDEPENDENT_AMBULATORY_CARE_PROVIDER_SITE_OTHER): Payer: Medicaid Other | Admitting: Family Medicine

## 2020-02-01 ENCOUNTER — Other Ambulatory Visit (HOSPITAL_COMMUNITY)
Admission: RE | Admit: 2020-02-01 | Discharge: 2020-02-01 | Disposition: A | Discharge status: Home / Self Care | Payer: Medicaid Other | Source: Ambulatory Visit | Attending: Family Medicine | Admitting: Family Medicine

## 2020-02-01 VITALS — BP 110/75 | HR 86 | Ht 61.5 in | Wt 152.4 lb

## 2020-02-01 DIAGNOSIS — Z113 Encounter for screening for infections with a predominantly sexual mode of transmission: Secondary | ICD-10-CM

## 2020-02-01 DIAGNOSIS — Z124 Encounter for screening for malignant neoplasm of cervix: Secondary | ICD-10-CM

## 2020-02-01 DIAGNOSIS — Z13220 Encounter for screening for lipoid disorders: Secondary | ICD-10-CM

## 2020-02-01 DIAGNOSIS — Z01419 Encounter for gynecological examination (general) (routine) without abnormal findings: Secondary | ICD-10-CM

## 2020-02-01 MED ORDER — NAPROXEN 500 MG PO TABS
500.0000 mg | ORAL_TABLET | Freq: Every day | ORAL | 1 refills | Status: DC | PRN
Start: 2020-02-01 — End: 2021-02-01

## 2020-02-01 NOTE — Progress Notes (Signed)
Date of Visit: 02/01/2020   HPI:  Patient presents today for a well woman exam.   Concerns today: none Periods: monthly Contraception: patch, happy with this form of birth control Pelvic symptoms: no vag discharge or pelvic pain Sexual activity: not currently STD Screening: desires today Pap smear status: normal cytology and negative HPV 1 year ago, patient strongly desires another pap today Exercise: no Smoking: no Alcohol: occasional, 1-2 times a year Drugs: no Mood: no concerns  Lost her job earlier this year, was laid off due to the employer not having a job for her to do with her accommodations. Is receiving unemployment and searching for new job.  PHYSICAL EXAM: BP 110/75   Pulse 86   Ht 5' 1.5" (1.562 m)   Wt 152 lb 6.4 oz (69.1 kg)   LMP 01/07/2020 (Approximate)   SpO2 100%   BMI 28.33 kg/m  Gen: NAD, pleasant, cooperative HEENT: NCAT, PERRL, no palpable thyromegaly or anterior cervical lymphadenopathy Heart: RRR, no murmurs Lungs: CTAB, NWOB Abdomen: soft, nontender to palpation Neuro: grossly nonfocal, speech normal GU: normal appearing external genitalia without lesions. Vagina is moist with white discharge. Cervix normal in appearance. No cervical motion tenderness or tenderness on bimanual exam. No adnexal masses.   ASSESSMENT/PLAN:  Health maintenance:  -STD screening: gc/chlamydia/trich, HIV, RPR obtained today -pap smear: not due but patient adamantly requested pap this year despite normal pap 1 year ago, done today -lipid screening: check lipid panel today -immunizations:  . Tdap: current . COVID: discussed at length today and strongly advised patient to be vaccinated and patient declined, stated she instead trusts her faith. Gave handout with reliable information and offered to discuss later if she has questions about it. -handout given on health maintenance topics  FOLLOW UP: Follow up in 1 year for next physical  [place] J. Ardelia Mems, [place]

## 2020-02-01 NOTE — Patient Instructions (Signed)
I strongly recommend you get the COVID vaccine. See handout on this. If you have questions about the vaccine, please call and I'll be happy to speak with you more.  Labs today - will call or send letter  Be well, Dr. Ardelia Mems    Health Maintenance, Female Adopting a healthy lifestyle and getting preventive care are important in promoting health and wellness. Ask your health care provider about:  The right schedule for you to have regular tests and exams.  Things you can do on your own to prevent diseases and keep yourself healthy. What should I know about diet, weight, and exercise? Eat a healthy diet   Eat a diet that includes plenty of vegetables, fruits, low-fat dairy products, and lean protein.  Do not eat a lot of foods that are high in solid fats, added sugars, or sodium. Maintain a healthy weight Body mass index (BMI) is used to identify weight problems. It estimates body fat based on height and weight. Your health care provider can help determine your BMI and help you achieve or maintain a healthy weight. Get regular exercise Get regular exercise. This is one of the most important things you can do for your health. Most adults should:  Exercise for at least 150 minutes each week. The exercise should increase your heart rate and make you sweat (moderate-intensity exercise).  Do strengthening exercises at least twice a week. This is in addition to the moderate-intensity exercise.  Spend less time sitting. Even light physical activity can be beneficial. Watch cholesterol and blood lipids Have your blood tested for lipids and cholesterol at 44 years of age, then have this test every 5 years. Have your cholesterol levels checked more often if:  Your lipid or cholesterol levels are high.  You are older than 44 years of age.  You are at high risk for heart disease. What should I know about cancer screening? Depending on your health history and family history, you may need  to have cancer screening at various ages. This may include screening for:  Breast cancer.  Cervical cancer.  Colorectal cancer.  Skin cancer.  Lung cancer. What should I know about heart disease, diabetes, and high blood pressure? Blood pressure and heart disease  High blood pressure causes heart disease and increases the risk of stroke. This is more likely to develop in people who have high blood pressure readings, are of African descent, or are overweight.  Have your blood pressure checked: ? Every 3-5 years if you are 74-21 years of age. ? Every year if you are 59 years old or older. Diabetes Have regular diabetes screenings. This checks your fasting blood sugar level. Have the screening done:  Once every three years after age 42 if you are at a normal weight and have a low risk for diabetes.  More often and at a younger age if you are overweight or have a high risk for diabetes. What should I know about preventing infection? Hepatitis B If you have a higher risk for hepatitis B, you should be screened for this virus. Talk with your health care provider to find out if you are at risk for hepatitis B infection. Hepatitis C Testing is recommended for:  Everyone born from [birth year] through 1965.  Anyone with known risk factors for hepatitis C. Sexually transmitted infections (STIs)  Get screened for STIs, including gonorrhea and chlamydia, if: ? You are sexually active and are younger than 44 years of age. ? You are older than 44  years of age and your health care provider tells you that you are at risk for this type of infection. ? Your sexual activity has changed since you were last screened, and you are at increased risk for chlamydia or gonorrhea. Ask your health care provider if you are at risk.  Ask your health care provider about whether you are at high risk for HIV. Your health care provider may recommend a prescription medicine to help prevent HIV infection. If you choose  to take medicine to prevent HIV, you should first get tested for HIV. You should then be tested every 3 months for as long as you are taking the medicine. Pregnancy  If you are about to stop having your period (premenopausal) and you may become pregnant, seek counseling before you get pregnant.  Take 400 to 800 micrograms (mcg) of folic acid every day if you become pregnant.  Ask for birth control (contraception) if you want to prevent pregnancy. Osteoporosis and menopause Osteoporosis is a disease in which the bones lose minerals and strength with aging. This can result in bone fractures. If you are 3 years old or older, or if you are at risk for osteoporosis and fractures, ask your health care provider if you should:  Be screened for bone loss.  Take a calcium or vitamin D supplement to lower your risk of fractures.  Be given hormone replacement therapy (HRT) to treat symptoms of menopause. Follow these instructions at home: Lifestyle  Do not use any products that contain nicotine or tobacco, such as cigarettes, e-cigarettes, and chewing tobacco. If you need help quitting, ask your health care provider.  Do not use street drugs.  Do not share needles.  Ask your health care provider for help if you need support or information about quitting drugs. Alcohol use  Do not drink alcohol if: ? Your health care provider tells you not to drink. ? You are pregnant, may be pregnant, or are planning to become pregnant.  If you drink alcohol: ? Limit how much you use to 0-1 drink a day. ? Limit intake if you are breastfeeding.  Be aware of how much alcohol is in your drink. In the [place] one drink equals one 12 oz bottle of beer (355 mL), one 5 oz glass of wine (148 mL), or one 1 oz glass of hard liquor (44 mL). General instructions  Schedule regular health, dental, and eye exams.  Stay current with your vaccines.  Tell your health care provider if: ? You often feel depressed. ? You  have ever been abused or do not feel safe at home. Summary  Adopting a healthy lifestyle and getting preventive care are important in promoting health and wellness.  Follow your health care provider's instructions about healthy diet, exercising, and getting tested or screened for diseases.  Follow your health care provider's instructions on monitoring your cholesterol and blood pressure. This information is not intended to replace advice given to you by your health care provider. Make sure you discuss any questions you have with your health care provider. Document Revised: 05/20/2018 Document Reviewed: 05/20/2018 Elsevier Patient Education  2020 [employer].

## 2020-02-02 LAB — CYTOLOGY - PAP
Chlamydia: NEGATIVE
Comment: NEGATIVE
Comment: NEGATIVE
Comment: NEGATIVE
Comment: NORMAL
Diagnosis: NEGATIVE
High risk HPV: NEGATIVE
Neisseria Gonorrhea: NEGATIVE
Trichomonas: NEGATIVE

## 2020-02-02 LAB — HIV ANTIBODY (ROUTINE TESTING W REFLEX): HIV Screen 4th Generation wRfx: NONREACTIVE

## 2020-02-02 LAB — LIPID PANEL
Chol/HDL Ratio: 3.3 ratio (ref 0.0–4.4)
Cholesterol, Total: 236 mg/dL — ABNORMAL HIGH (ref 100–199)
HDL: 72 mg/dL (ref >39)
LDL Chol Calc (NIH): 153 mg/dL — ABNORMAL HIGH (ref 0–99)
Triglycerides: 65 mg/dL (ref 0–149)
VLDL Cholesterol Cal: 11 mg/dL (ref 5–40)

## 2020-02-02 LAB — RPR: RPR Ser Ql: NONREACTIVE

## 2020-10-02 ENCOUNTER — Telehealth: Payer: Self-pay | Admitting: Family Medicine

## 2020-10-02 NOTE — Telephone Encounter (Signed)
Called patient. She is interested in long term disability, ie social security disability Advised I don't do this paperwork - she would need to see physician who specializes in disability evaluations Encouraged her to speak with her attorney for more information  Leeanne Rio, MD

## 2020-10-02 NOTE — Telephone Encounter (Signed)
Patient is calling wanting the doctor to call her, stating she is needing to be put on disability. Please advise. Thanks!

## 2020-10-10 ENCOUNTER — Ambulatory Visit: Payer: Medicaid Other | Admitting: Family Medicine

## 2021-02-01 ENCOUNTER — Ambulatory Visit (INDEPENDENT_AMBULATORY_CARE_PROVIDER_SITE_OTHER): Payer: Medicaid Other | Admitting: Family Medicine

## 2021-02-01 ENCOUNTER — Encounter: Payer: Self-pay | Admitting: Family Medicine

## 2021-02-01 ENCOUNTER — Other Ambulatory Visit (HOSPITAL_COMMUNITY)
Admission: RE | Admit: 2021-02-01 | Discharge: 2021-02-01 | Disposition: A | Discharge status: Home / Self Care | Payer: Medicaid Other | Source: Ambulatory Visit | Attending: Family Medicine | Admitting: Family Medicine

## 2021-02-01 ENCOUNTER — Other Ambulatory Visit: Payer: Self-pay

## 2021-02-01 VITALS — BP 139/65 | HR 89 | Wt 158.8 lb

## 2021-02-01 DIAGNOSIS — Z124 Encounter for screening for malignant neoplasm of cervix: Secondary | ICD-10-CM | POA: Insufficient documentation

## 2021-02-01 DIAGNOSIS — J309 Allergic rhinitis, unspecified: Secondary | ICD-10-CM

## 2021-02-01 DIAGNOSIS — Z1231 Encounter for screening mammogram for malignant neoplasm of breast: Secondary | ICD-10-CM

## 2021-02-01 DIAGNOSIS — Z01419 Encounter for gynecological examination (general) (routine) without abnormal findings: Secondary | ICD-10-CM | POA: Diagnosis present

## 2021-02-01 DIAGNOSIS — H1013 Acute atopic conjunctivitis, bilateral: Secondary | ICD-10-CM

## 2021-02-01 MED ORDER — NAPROXEN 500 MG PO TABS
500.0000 mg | ORAL_TABLET | Freq: Every day | ORAL | 1 refills | Status: DC | PRN
Start: 2021-02-01 — End: 2022-02-04

## 2021-02-01 MED ORDER — FLUTICASONE PROPIONATE 50 MCG/ACT NA SUSP: 2.0000 | Freq: Every day | NASAL | 2 refills | Status: DC

## 2021-02-01 MED ORDER — XULANE 150-35 MCG/24HR TD PTWK
MEDICATED_PATCH | TRANSDERMAL | 1 refills | Status: DC
Start: 2021-02-01 — End: 2021-10-02

## 2021-02-01 NOTE — Patient Instructions (Addendum)
To schedule your mammogram, call the [facility] at [phone]. They are located at [address], [address].      Health Maintenance, Female Adopting a healthy lifestyle and getting preventive care are important in promoting health and wellness. Ask your health care provider about: The right schedule for you to have regular tests and exams. Things you can do on your own to prevent diseases and keep yourself healthy. What should I know about diet, weight, and exercise? Eat a healthy diet  Eat a diet that includes plenty of vegetables, fruits, low-fat dairy products, and lean protein. Do not eat a lot of foods that are high in solid fats, added sugars, or sodium.  Maintain a healthy weight Body mass index (BMI) is used to identify weight problems. It estimates body fat based on height and weight. Your health care provider can help determineyour BMI and help you achieve or maintain a healthy weight. Get regular exercise Get regular exercise. This is one of the most important things you can do for your health. Most adults should: Exercise for at least 150 minutes each week. The exercise should increase your heart rate and make you sweat (moderate-intensity exercise). Do strengthening exercises at least twice a week. This is in addition to the moderate-intensity exercise. Spend less time sitting. Even light physical activity can be beneficial. Watch cholesterol and blood lipids Have your blood tested for lipids and cholesterol at 45 years of age, then havethis test every 5 years. Have your cholesterol levels checked more often if: Your lipid or cholesterol levels are high. You are older than 45 years of age. You are at high risk for heart disease. What should I know about cancer screening? Depending on your health history and family history, you may need to have cancer screening at various ages. This may include screening for: Breast cancer. Cervical  cancer. Colorectal cancer. Skin cancer. Lung cancer. What should I know about heart disease, diabetes, and high blood pressure? Blood pressure and heart disease High blood pressure causes heart disease and increases the risk of stroke. This is more likely to develop in people who have high blood pressure readings, are of African descent, or are overweight. Have your blood pressure checked: Every 3-5 years if you are 62-75 years of age. Every year if you are 64 years old or older. Diabetes Have regular diabetes screenings. This checks your fasting blood sugar level. Have the screening done: Once every three years after age 10 if you are at a normal weight and have a low risk for diabetes. More often and at a younger age if you are overweight or have a high risk for diabetes. What should I know about preventing infection? Hepatitis B If you have a higher risk for hepatitis B, you should be screened for this virus. Talk with your health care provider to find out if you are at risk forhepatitis B infection. Hepatitis C Testing is recommended for: Everyone born from [birth year] through 1965. Anyone with known risk factors for hepatitis C. Sexually transmitted infections (STIs) Get screened for STIs, including gonorrhea and chlamydia, if: You are sexually active and are younger than 45 years of age. You are older than 45 years of age and your health care provider tells you that you are at risk for this type of infection. Your sexual activity has changed since you were last screened, and you are at increased risk for chlamydia or gonorrhea. Ask your health care provider if you  are at risk. Ask your health care provider about whether you are at high risk for HIV. Your health care provider may recommend a prescription medicine to help prevent HIV infection. If you choose to take medicine to prevent HIV, you should first get tested for HIV. You should then be tested every 3 months for as long as you are  taking the medicine. Pregnancy If you are about to stop having your period (premenopausal) and you may become pregnant, seek counseling before you get pregnant. Take 400 to 800 micrograms (mcg) of folic acid every day if you become pregnant. Ask for birth control (contraception) if you want to prevent pregnancy. Osteoporosis and menopause Osteoporosis is a disease in which the bones lose minerals and strength with aging. This can result in bone fractures. If you are 58 years old or older, or if you are at risk for osteoporosis and fractures, ask your health care provider if you should: Be screened for bone loss. Take a calcium or vitamin D supplement to lower your risk of fractures. Be given hormone replacement therapy (HRT) to treat symptoms of menopause. Follow these instructions at home: Lifestyle Do not use any products that contain nicotine or tobacco, such as cigarettes, e-cigarettes, and chewing tobacco. If you need help quitting, ask your health care provider. Do not use street drugs. Do not share needles. Ask your health care provider for help if you need support or information about quitting drugs. Alcohol use Do not drink alcohol if: Your health care provider tells you not to drink. You are pregnant, may be pregnant, or are planning to become pregnant. If you drink alcohol: Limit how much you use to 0-1 drink a day. Limit intake if you are breastfeeding. Be aware of how much alcohol is in your drink. In the [place] one drink equals one 12 oz bottle of beer (355 mL), one 5 oz glass of wine (148 mL), or one 1 oz glass of hard liquor (44 mL). General instructions Schedule regular health, dental, and eye exams. Stay current with your vaccines. Tell your health care provider if: You often feel depressed. You have ever been abused or do not feel safe at home. Summary Adopting a healthy lifestyle and getting preventive care are important in promoting health and wellness. Follow  your health care provider's instructions about healthy diet, exercising, and getting tested or screened for diseases. Follow your health care provider's instructions on monitoring your cholesterol and blood pressure. This information is not intended to replace advice given to you by your health care provider. Make sure you discuss any questions you have with your healthcare provider. Document Revised: 05/20/2018 Document Reviewed: 05/20/2018 Elsevier Patient Education  2022 [employer].

## 2021-02-01 NOTE — Progress Notes (Signed)
error 

## 2021-02-01 NOTE — Progress Notes (Signed)
    Date of Visit: 02/01/2021   SUBJECTIVE:   HPI:  Katherine presents today for a well woman exam.   Katherine Woods has no acute complaints. There have been no major changes in her life regarding health or life events. She is not currently working.   She is requesting a pap smear.  Medications list was updated.  Concerns today: None  Periods: Regular Contraception: None currently, but plans to use Xulane patches if she becomes sexually active again Pelvic symptoms: None, previous ultrasound and imaging identified fibroids. They are not causing any discomfort. Sexual activity: None, last active 5 years ago STD Screening: declines  Pap smear status: UTD, patient insistent on having another pap smear today despite normal pap 1 year ago Smoking: None Alcohol: Occasional Drugs: None Mood: No concerns Cancers in family: No history of known cervical cancer in family  OBJECTIVE:   BP 139/65   Pulse 89   Wt 158 lb 12.8 oz (72 kg)   SpO2 100%   BMI 29.52 kg/m  Gen: NAD, pleasant, cooperative HEENT: NCAT, PERRL, no palpable thyromegaly or anterior cervical lymphadenopathy Heart: RRR, no murmurs Lungs: CTAB, NWOB Abdomen: soft, nontender to palpation Neuro: grossly nonfocal, speech normal GU: normal appearing external genitalia without lesions. Vagina is moist with white discharge. Cervix normal in appearance. No cervical motion tenderness or tenderness on bimanual exam. No adnexal masses.  SKIN: Warm, dry  ASSESSMENT/PLAN:   Health maintenance:  -pap smear: not due, patient fervently requested pap this year despite normal pap 1 year ago. She understands she may be charged for this if insurance does not cover. Completed today. -mammogram: ordered today, gave handout on how to schedule -lipid screening: Last completed a year ago -immunizations:  Flu: recommend once available Tdap: UTD COVID: Declines. Discussed at length the benefits of vaccination and possible side effects.  Strongly advised patient of the value of vaccinations and that she can contact the clinic should she change her mind. Did not share any hesitancies about the vaccine when asked. -handout given on health maintenance topics  Patient had questions about her prior dx of herniated disc. Currently asymptomatic. Gave handout for her to review.  FOLLOW UP: Follow up in 1 year for physical exam  Cristobal Goldmann, Medical Student  Patient seen along with MS3 student Cristobal Goldmann. I personally evaluated this patient along with the student, and verified all aspects of the history, physical exam, and medical decision making as documented by the student. I agree with the student's documentation and have made all necessary edits.  Chrisandra Netters, MD  [place]

## 2021-02-05 LAB — CYTOLOGY - PAP
Comment: NEGATIVE
Diagnosis: NEGATIVE
Diagnosis: REACTIVE
High risk HPV: NEGATIVE

## 2021-02-28 ENCOUNTER — Encounter: Payer: Self-pay | Admitting: Family Medicine

## 2021-09-30 ENCOUNTER — Other Ambulatory Visit: Payer: Self-pay | Admitting: Family Medicine

## 2021-11-13 ENCOUNTER — Encounter: Payer: Self-pay | Admitting: *Deleted

## 2022-02-04 ENCOUNTER — Other Ambulatory Visit (HOSPITAL_COMMUNITY)
Admission: RE | Admit: 2022-02-04 | Discharge: 2022-02-04 | Disposition: A | Discharge status: Home / Self Care | Payer: Medicaid Other | Source: Ambulatory Visit | Attending: Family Medicine | Admitting: Family Medicine

## 2022-02-04 ENCOUNTER — Ambulatory Visit (INDEPENDENT_AMBULATORY_CARE_PROVIDER_SITE_OTHER): Payer: Medicaid Other | Admitting: Family Medicine

## 2022-02-04 VITALS — BP 110/80 | HR 85 | Ht 62.0 in | Wt 164.0 lb

## 2022-02-04 DIAGNOSIS — Z01419 Encounter for gynecological examination (general) (routine) without abnormal findings: Secondary | ICD-10-CM

## 2022-02-04 DIAGNOSIS — Z3045 Encounter for surveillance of transdermal patch hormonal contraceptive device: Secondary | ICD-10-CM

## 2022-02-04 DIAGNOSIS — Z124 Encounter for screening for malignant neoplasm of cervix: Secondary | ICD-10-CM | POA: Insufficient documentation

## 2022-02-04 DIAGNOSIS — Z0000 Encounter for general adult medical examination without abnormal findings: Secondary | ICD-10-CM

## 2022-02-04 DIAGNOSIS — H1013 Acute atopic conjunctivitis, bilateral: Secondary | ICD-10-CM

## 2022-02-04 MED ORDER — XULANE 150-35 MCG/24HR TD PTWK: MEDICATED_PATCH | TRANSDERMAL | 4 refills | Status: DC

## 2022-02-04 MED ORDER — FLUTICASONE PROPIONATE 50 MCG/ACT NA SUSP: 2.0000 | Freq: Every day | NASAL | 11 refills | Status: DC

## 2022-02-04 MED ORDER — NAPROXEN 500 MG PO TABS: 500.0000 mg | ORAL_TABLET | Freq: Every day | ORAL | 4 refills | Status: DC | PRN

## 2022-02-04 NOTE — Assessment & Plan Note (Signed)
Doing well with combined estrogen-progesterone patch. Continue, refilled x1 year.

## 2022-02-04 NOTE — Progress Notes (Signed)
  Date of Visit: 02/04/2022   SUBJECTIVE:   HPI:  Katherine Woods presents today for a well woman exam.   Concerns today: refill of allergy medication and naproxen Periods: yes Contraception: xulane patch Pelvic symptoms: no pelvic pain or discharge  Sexual activity: one female partner STD Screening: desires testing today Pap smear status: desires today, last pap nromal 1 year ago but has new sexual partner Exercise: not much Diet: plans to start eating better soon Smoking: no Alcohol: no Drugs: no Mood: no Dentist: no Cancers in family: doesn't know any specifics, had cousin with unknown cancer  OBJECTIVE:   BP 110/80   Pulse 85   Ht '5\' 2"'$  (1.575 m)   Wt 164 lb (74.4 kg)   BMI 30.00 kg/m  Gen: NAD, pleasant, cooperative HEENT: NCAT, PERRL, no palpable thyromegaly or anterior cervical lymphadenopathy Heart: RRR, no murmurs Lungs: CTAB, NWOB Abdomen: soft, nontender to palpation Neuro: grossly nonfocal, speech normal GU: normal appearing external genitalia without lesions. Vagina is moist with white discharge. Cervix normal in appearance. Chaperone present - Katherine Woods CMA  ASSESSMENT/PLAN:   Health maintenance:  -STD screening: swabs obtained today -pap smear: updated today given new sexual partner, patient prefers to have another pap this year despite normals. Will send cervical cytology, not HPV -mammogram: UTD -colon cancer screening: gave handout on screening options, patient unsure if her family planning medicaid will cover -immunizations:  Flu: encouraged to get, patient declines Tdap: UTD COVID: declines  Refill naproxen for as needed use, uses for pain in arm, about 2 tabs total per week - advised not taking 2 pills together, max of '500mg'$  twice daily  Allergies - refill flonase, works well for patient   Contraception management Doing well with combined estrogen-progesterone patch. Continue, refilled x1 year.   FOLLOW UP: Follow up in 1 year for CPE,  sooner if needed  [place] J. Ardelia Mems, [place]

## 2022-02-04 NOTE — Patient Instructions (Addendum)
It was great to see you again today!  See handout below on colon cancer screening  Pap and STI testing today  Refilled your flonase and naproxen, as well as your birth control patches  Follow up with me in 1 year, sooner if needed  Be well, Dr. Ardelia Mems   Colorectal Cancer Screening  Colorectal cancer screening is a group of tests that are used to check for colorectal cancer before symptoms develop. Colorectal refers to the colon and rectum. The colon and rectum are located at the end of the digestive tract and carry stool (feces) out of the body. Who should have screening? All adults who are 80-5 years old should have screening. Your health care provider may recommend screening before age 55. You will have tests every 1-10 years, depending on your results and the type of screening test. Screening recommendations for adults who are 75-44 years old vary depending on a person's health. People older than age 38 should no longer get colorectal cancer screening. You may have screening tests starting before age 29, or more often than other people, if you have any of these risk factors: A personal or family history of colorectal cancer or abnormal growths known as polyps in your colon. Inflammatory bowel disease, such as ulcerative colitis or Crohn's disease. A history of having radiation treatment to the abdomen or the area between the hip bones (pelvic area) for cancer. A type of genetic syndrome that is passed from parent to child (hereditary), such as: Lynch syndrome. Familial adenomatous polyposis. Turcot syndrome. Peutz-Jeghers syndrome. MUTYH-associated polyposis (MAP). A personal history of diabetes. Types of tests There are several types of colorectal screening tests. You may have one or more of the following: Guaiac-based fecal occult blood testing. For this test, a stool sample is checked for hidden (occult) blood, which could be a sign of colorectal cancer. Fecal immunochemical  test (FIT). For this test, a stool sample is checked for blood, which could be a sign of colorectal cancer. Stool DNA test. For this test, a stool sample is checked for blood and changes in DNA that could lead to colorectal cancer. Sigmoidoscopy. During this test, a thin, flexible tube with a camera on the end, called a sigmoidoscope, is used to examine the rectum and the lower colon. Colonoscopy. During this test, a long, flexible tube with a camera on the end, called a colonoscope, is used to examine the entire colon and rectum. Also, sometimes a tissue sample is taken to be looked at under a microscope (biopsy) or small polyps are removed during this test. Virtual colonoscopy. Instead of a colonoscope, this type of colonoscopy uses a CT scan to take pictures of the colon and rectum. A CT scan is a type of X-ray that is made using computers. What are the benefits of screening? Screening reduces your risk for colorectal cancer and can help identify cancer at an early stage, when the cancer can be removed or treated more easily. It is common for polyps to form in the lining of the colon, especially as you age. These polyps may be cancerous or become cancerous over time. Screening can identify these polyps. What are the risks of screening? Generally, these are safe tests. However, problems may occur, including: The need for more tests to confirm results from a stool sample test. Stool sample tests have fewer risks than other types of screening tests. Being exposed to low levels of radiation, if you had a test involving X-rays. This may slightly increase your  cancer risk. The benefit of detecting cancer outweighs the slight increase in risk. Bleeding, damage to the intestine, or infection caused by a sigmoidoscopy or colonoscopy. A reaction to medicines given during a sigmoidoscopy or colonoscopy. Talk with your health care provider to understand your risk for colorectal cancer and to make a screening  plan that is right for you. Questions to ask your health care provider When should I start colorectal cancer screening? What is my risk for colorectal cancer? How often do I need screening? Which screening tests do I need? How do I get my test results? What do my results mean? Where to find more information Learn more about colorectal cancer screening from: The American Cancer Society: cancer.[place]: cancer.gov Summary Colorectal cancer screening is a group of tests used to check for colorectal cancer before symptoms develop. All adults who are 29-4 years old should have screening. Your health care provider may recommend screening before age 41. You may have screening tests starting before age 38, or more often than other people, if you have certain risk factors. Screening reduces your risk for colorectal cancer and can help identify cancer at an early stage, when the cancer can be removed or treated more easily. Talk with your health care provider to understand your risk for colorectal cancer and to make a screening plan that is right for you. This information is not intended to replace advice given to you by your health care provider. Make sure you discuss any questions you have with your health care provider. Document Revised: 09/15/2019 Document Reviewed: 09/15/2019 Elsevier Patient Education  [employer].

## 2022-02-07 LAB — CYTOLOGY - PAP
Adequacy: ABSENT
Chlamydia: NEGATIVE
Comment: NEGATIVE
Comment: NEGATIVE
Comment: NORMAL
Diagnosis: NEGATIVE
Neisseria Gonorrhea: NEGATIVE
Trichomonas: NEGATIVE

## 2022-03-18 ENCOUNTER — Telehealth: Payer: Self-pay | Admitting: *Deleted

## 2022-03-18 NOTE — Telephone Encounter (Signed)
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  She states that she hasn't had one in a while and would to know if there have been any changes.  Patient was informed that she would need an appointment but declined due to having medicaid family planning and would need to pay out of pocket.  She would prefer to save that money for the scan.  Will forward to MD to advise.  Abisai Coble,CMA

## 2022-03-18 NOTE — Telephone Encounter (Signed)
Will forward to team to reach out to patient to schedule an appt.  Prachi Oftedahl,CMA

## 2022-03-19 NOTE — Telephone Encounter (Signed)
Spoke with pt and she declined again saying she only has the money to get the CT and that it would cost for her to come in for an appt.(Didn't want to get billed either). She asked why cant the dr just order it and I told her is was bad patient care and that the dr license would be in jeopardy by just ordering imaging without an evaluation. Brahim Dolman Kennon Holter, CMA

## 2022-03-20 NOTE — Telephone Encounter (Signed)
Called patient, got VM. Left message re-iterating that she would need to be seen before ordering any imaging, as I am not sure this is needed and we would want to hear more about her concerns as an in person appt.  Yehuda Savannah MD

## 2022-03-20 NOTE — Telephone Encounter (Signed)
She notes memory concerns in the past year. She has called and spoken at [facility] and wants to get a head CT with contrast due to her history of TBI. I discussed that memory concerns would need an office visit as we like to do a full neuro exam, lab work, and see if imaging is warranted as she may not need to spend her money on this. She asked about costs of individual labs and I stated that I do not know and depends on different insurance but could be discussed more at visit. Answered all questions. She states she will have to give [place] a call back.  Yehuda Savannah MD

## 2022-03-21 NOTE — Telephone Encounter (Signed)
Patient returns call to nurse line. She is asking which specific labs would need to be obtained so she can see how much they will cost.   Please advise.   Please return call to patient at [phone].  Talbot Grumbling, RN

## 2022-03-22 NOTE — Telephone Encounter (Signed)
Patient calls nurse line again requesting to speak with Katherine Woods.   I advised patient she would need an apt for evaluation before any labs can be ordered.   Patient reports she has no insurance at this time. Patient is requesting an estimate of how much the visit will cost her.   Will forward to admin team to advise if possible.

## 2022-03-27 NOTE — Telephone Encounter (Signed)
Patient returns call to nurse line regarding estimate of appointment.   Will forward to admin team for update.   Please return call to patient at [phone].  Talbot Grumbling, RN

## 2022-05-23 ENCOUNTER — Encounter: Payer: Self-pay | Admitting: Family Medicine

## 2022-05-23 ENCOUNTER — Other Ambulatory Visit (INDEPENDENT_AMBULATORY_CARE_PROVIDER_SITE_OTHER): Payer: Medicaid Other

## 2022-05-23 ENCOUNTER — Ambulatory Visit (INDEPENDENT_AMBULATORY_CARE_PROVIDER_SITE_OTHER): Payer: Medicaid Other | Admitting: Family Medicine

## 2022-05-23 VITALS — BP 112/80 | HR 83 | Temp 98.2°F | Ht 61.18 in | Wt 166.0 lb

## 2022-05-23 DIAGNOSIS — R413 Other amnesia: Secondary | ICD-10-CM

## 2022-05-23 DIAGNOSIS — R4189 Other symptoms and signs involving cognitive functions and awareness: Secondary | ICD-10-CM

## 2022-05-23 DIAGNOSIS — S069XAS Unspecified intracranial injury with loss of consciousness status unknown, sequela: Secondary | ICD-10-CM | POA: Diagnosis not present

## 2022-05-23 DIAGNOSIS — Z13220 Encounter for screening for lipoid disorders: Secondary | ICD-10-CM

## 2022-05-23 LAB — POCT GLYCOSYLATED HEMOGLOBIN (HGB A1C): Hemoglobin A1C: 5.5 % (ref 4.0–5.6)

## 2022-05-23 NOTE — Patient Instructions (Signed)
It was great to see you again today!  Ordered MRI of your brain, also a lot of labwork Return today at 2pm to have the labs drawn  Schedule follow up with me at your convenience  Be well, Dr. Ardelia Mems
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
## 2022-05-24 ENCOUNTER — Telehealth: Payer: Self-pay | Admitting: Family Medicine

## 2022-05-24 ENCOUNTER — Telehealth: Payer: Self-pay

## 2022-05-24 LAB — CBC
Hematocrit: 34.9 % (ref 34.0–46.6)
Hemoglobin: 11.5 g/dL (ref 11.1–15.9)
MCH: 28.8 pg (ref 26.6–33.0)
MCHC: 33 g/dL (ref 31.5–35.7)
MCV: 88 fL (ref 79–97)
Platelets: 373 10*3/uL (ref 150–450)
RBC: 3.99 x10E6/uL (ref 3.77–5.28)
RDW: 12.1 % (ref 11.7–15.4)
WBC: 6.8 10*3/uL (ref 3.4–10.8)

## 2022-05-24 LAB — CMP14+EGFR
ALT: 11 IU/L (ref 0–32)
AST: 17 IU/L (ref 0–40)
Albumin/Globulin Ratio: 1.4 (ref 1.2–2.2)
Albumin: 4 g/dL (ref 3.9–4.9)
Alkaline Phosphatase: 90 IU/L (ref 44–121)
BUN/Creatinine Ratio: 11 (ref 9–23)
BUN: 9 mg/dL (ref 6–24)
Bilirubin Total: 0.3 mg/dL (ref 0.0–1.2)
CO2: 25 mmol/L (ref 20–29)
Calcium: 9.1 mg/dL (ref 8.7–10.2)
Chloride: 101 mmol/L (ref 96–106)
Creatinine, Ser: 0.81 mg/dL (ref 0.57–1.00)
Globulin, Total: 2.9 g/dL (ref 1.5–4.5)
Glucose: 111 mg/dL — ABNORMAL HIGH (ref 70–99)
Potassium: 4.2 mmol/L (ref 3.5–5.2)
Sodium: 139 mmol/L (ref 134–144)
Total Protein: 6.9 g/dL (ref 6.0–8.5)
eGFR: 91 mL/min/{1.73_m2} (ref >59)

## 2022-05-24 LAB — FOLATE: Folate: >20 ng/mL (ref >3.0)

## 2022-05-24 LAB — RPR: RPR Ser Ql: NONREACTIVE

## 2022-05-24 LAB — VITAMIN B12: Vitamin B-12: 693 pg/mL (ref 232–1245)

## 2022-05-24 LAB — HIV ANTIBODY (ROUTINE TESTING W REFLEX): HIV Screen 4th Generation wRfx: NONREACTIVE

## 2022-05-24 NOTE — Telephone Encounter (Signed)
Patient calls nurse line requesting to be scheduled for MRI.   I do not see where one has been ordered for patient.   Patient saw PCP yesterday. Will forward.

## 2022-05-24 NOTE — Telephone Encounter (Signed)
Patient called asking about MRI orders. I told her I would send a message back asking for the order to be placed.   Please contact patient when order is placed.   Thanks!

## 2022-05-25 DIAGNOSIS — R4189 Other symptoms and signs involving cognitive functions and awareness: Secondary | ICD-10-CM | POA: Insufficient documentation

## 2022-05-25 NOTE — Telephone Encounter (Signed)
See other phone note regarding this MRI

## 2022-05-25 NOTE — Telephone Encounter (Signed)
MRI ordered. Will need prior auth - I had told patient it would be several days before she'd hear back about it being scheduled. Routing to [employer] so she is aware of PA needed  Leeanne Rio, MD

## 2022-05-25 NOTE — Assessment & Plan Note (Addendum)
MoCA score today 24 out of 30.  She does have some mild cognitive impairment.  I suspect this is due to her history of severe TBI.  Reviewed evaluation for reversible causes of cognitive impairment.  She has not had any recent neuroimaging of the brain.  I think it is worth obtaining an updated MRI. Plan: -MRI brain without contrast -Lab evaluation: HIV, RPR, B12, folate, CBC, CMP

## 2022-05-27 NOTE — Telephone Encounter (Signed)
I will work on this during the week.  Nabil Bubolz,CMA

## 2022-05-28 NOTE — Telephone Encounter (Signed)
Patient called to check on the status of scheduling MRI. I informed her of the information below. She said she would like to speak with Dr. Ardelia Mems. I let her know she documented it could be several days and that she would receive a phone call but she said she would prefer to still speak with Dr. Ardelia Mems.   Please call patient at [phone]

## 2022-05-29 NOTE — Telephone Encounter (Signed)
Patient informed that I am waiting for an update from medicaid and that it could be 5-7 days.  I told her that I would update team for scheduling once I find out the decision.  I also asked that she be patient with scheduling and the approval due to the upcoming holidays.  Patient voiced understanding.  Smita Lesh,CMA

## 2022-05-29 NOTE — Telephone Encounter (Signed)
Called patient. She again is asking about her MRI being scheduled. I reiterated to her that it first has to be approved by [facility] before it can be scheduled. I told her I would route it to Jazmin to give patient an update on auth status and anticipated timeline.  Jazmin can you give an update?  Thanks so much! Leeanne Rio, MD

## 2022-06-05 NOTE — Telephone Encounter (Signed)
MRI was approved by medicaid.   Patient scheduled for 06/15/2022 at 10am entrance C.   Attempted to call patient to inform, however no answer.   VM left with detailed information.

## 2022-06-07 DIAGNOSIS — Z1231 Encounter for screening mammogram for malignant neoplasm of breast: Secondary | ICD-10-CM | POA: Diagnosis not present

## 2022-06-13 DIAGNOSIS — H5213 Myopia, bilateral: Secondary | ICD-10-CM | POA: Diagnosis not present

## 2022-06-14 ENCOUNTER — Ambulatory Visit: Payer: Medicaid Other | Admitting: Family Medicine

## 2022-06-15 ENCOUNTER — Ambulatory Visit (HOSPITAL_COMMUNITY)
Admission: RE | Admit: 2022-06-15 | Discharge: 2022-06-15 | Disposition: A | Discharge status: Home / Self Care | Payer: Medicaid Other | Source: Ambulatory Visit | Attending: Family Medicine | Admitting: Family Medicine

## 2022-06-15 DIAGNOSIS — R4189 Other symptoms and signs involving cognitive functions and awareness: Secondary | ICD-10-CM

## 2022-06-15 DIAGNOSIS — S069XAS Unspecified intracranial injury with loss of consciousness status unknown, sequela: Secondary | ICD-10-CM | POA: Insufficient documentation

## 2022-06-15 DIAGNOSIS — J3489 Other specified disorders of nose and nasal sinuses: Secondary | ICD-10-CM | POA: Diagnosis not present

## 2022-06-15 DIAGNOSIS — R413 Other amnesia: Secondary | ICD-10-CM | POA: Diagnosis not present

## 2022-06-17 ENCOUNTER — Telehealth: Payer: Self-pay | Admitting: *Deleted

## 2022-06-17 DIAGNOSIS — R413 Other amnesia: Secondary | ICD-10-CM

## 2022-06-17 DIAGNOSIS — Z13220 Encounter for screening for lipoid disorders: Secondary | ICD-10-CM

## 2022-06-17 NOTE — Telephone Encounter (Signed)
Pt calls because she was able to see her MRI results in mychart and would like to know what they mean. Christen Bame, CMA

## 2022-06-19 NOTE — Telephone Encounter (Signed)
Patient returns call to nurse line to check status of MRI results. She is requesting returned call to [phone].  Talbot Grumbling, RN

## 2022-06-19 NOTE — Telephone Encounter (Signed)
Attempted to reach patient, no answer. Left HIPAA compliant VM offering to speak with her tomorrow since our office is closed for the afternoon  Leeanne Rio, MD

## 2022-06-20 NOTE — Telephone Encounter (Signed)
Patient returns call to nurse line. She is available anytime this afternoon to discuss the results.   She is requesting a returned call at [phone].   Talbot Grumbling, RN

## 2022-06-20 NOTE — Telephone Encounter (Signed)
Called patient, reviewed MRI results, overall unremarkable, likely findings in L frontal area are from prior TBI  She asks about what else can be done to help her memory.  Advised I am happy to refer her to a neurologist if she would like to go speak with them.  She would like this.  Referral entered.  Of note did not have lipids or TSH drawn with recent lab draw.  Advise she can schedule lab visit for this.  She will check her schedule and call us back to schedule.  Patient appreciative.  Leeanne Rio, MD

## 2022-06-20 NOTE — Addendum Note (Signed)
Addended by: Leeanne Rio on: 06/20/2022 05:41 PM   Modules accepted: Orders

## 2022-06-24 ENCOUNTER — Other Ambulatory Visit: Payer: Medicaid Other

## 2022-06-24 DIAGNOSIS — R413 Other amnesia: Secondary | ICD-10-CM | POA: Diagnosis not present

## 2022-06-24 DIAGNOSIS — Z13220 Encounter for screening for lipoid disorders: Secondary | ICD-10-CM | POA: Diagnosis not present

## 2022-06-25 ENCOUNTER — Telehealth: Payer: Self-pay | Admitting: Family Medicine

## 2022-06-25 DIAGNOSIS — E785 Hyperlipidemia, unspecified: Secondary | ICD-10-CM

## 2022-06-25 LAB — LIPID PANEL
Chol/HDL Ratio: 5.1 ratio — ABNORMAL HIGH (ref 0.0–4.4)
Cholesterol, Total: 344 mg/dL — ABNORMAL HIGH (ref 100–199)
HDL: 68 mg/dL (ref >39)
LDL Chol Calc (NIH): 269 mg/dL — ABNORMAL HIGH (ref 0–99)
Triglycerides: 61 mg/dL (ref 0–149)
VLDL Cholesterol Cal: 7 mg/dL (ref 5–40)

## 2022-06-25 LAB — TSH: TSH: 1.14 u[IU]/mL (ref 0.450–4.500)

## 2022-06-25 NOTE — Addendum Note (Signed)
Addended by: Leeanne Rio on: 06/25/2022 03:04 PM   Modules accepted: Orders

## 2022-06-25 NOTE — Telephone Encounter (Signed)
Orders entered.  Thank you for contacting patient to schedule.  Leeanne Rio, MD

## 2022-06-25 NOTE — Telephone Encounter (Signed)
Patient calls nurse line requesting to speak with PCP.   Patient reports she has decided to move forward with lipid panel.   Patient reports she will plan to come in next month.   Will forward to PCP to place future orders and I will call to schedule a lab apt for her.

## 2022-06-25 NOTE — Telephone Encounter (Signed)
Called patient to discuss labs.  LDL markedly elevated at over 260.  Discussed recommendation to either recheck lipid panel to confirm accuracy of this elevated level, or starting statin therapy to reduce cholesterol and reduce cardiovascular risk.  Patient is not interested in having any labs redrawn, nor is she interested in starting medication to lower her cholesterol.  Reiterated my recommendation to either recheck this lab or start statin, but affirmed that it is her right to decline these interventions.  Advise she let me know if she changes her mind.  Patient appreciative.  Leeanne Rio, MD

## 2022-06-28 NOTE — Telephone Encounter (Signed)
Patient has been scheduled for 2/29.  Patient advised to be fasting for labs.   She voiced understanding.

## 2022-08-08 ENCOUNTER — Other Ambulatory Visit: Payer: Medicaid Other

## 2022-08-08 ENCOUNTER — Ambulatory Visit: Payer: Self-pay | Admitting: Neurology

## 2022-08-08 DIAGNOSIS — E785 Hyperlipidemia, unspecified: Secondary | ICD-10-CM | POA: Diagnosis not present

## 2022-08-09 LAB — LIPID PANEL
Chol/HDL Ratio: 4.3 ratio (ref 0.0–4.4)
Cholesterol, Total: 269 mg/dL — ABNORMAL HIGH (ref 100–199)
HDL: 62 mg/dL (ref >39)
LDL Chol Calc (NIH): 183 mg/dL — ABNORMAL HIGH (ref 0–99)
Triglycerides: 136 mg/dL (ref 0–149)
VLDL Cholesterol Cal: 24 mg/dL (ref 5–40)

## 2022-08-28 ENCOUNTER — Encounter: Payer: Self-pay | Admitting: Neurology

## 2022-08-28 ENCOUNTER — Ambulatory Visit: Payer: Medicaid Other | Admitting: Neurology

## 2022-08-28 VITALS — BP 108/73 | HR 82 | Ht 61.5 in | Wt 166.0 lb

## 2022-08-28 DIAGNOSIS — R413 Other amnesia: Secondary | ICD-10-CM | POA: Diagnosis not present

## 2022-08-28 DIAGNOSIS — E519 Thiamine deficiency, unspecified: Secondary | ICD-10-CM

## 2022-08-28 DIAGNOSIS — S069X9A Unspecified intracranial injury with loss of consciousness of unspecified duration, initial encounter: Secondary | ICD-10-CM
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
## 2022-08-28 NOTE — Patient Instructions (Addendum)
Formal memory testing - will order  Check one lab EEG routine office then ambulatory (at home for 2 days)    Major Depressive Disorder, Adult Major depressive disorder (MDD) is a mental health condition. It may also be called clinical depression or unipolar depression. MDD causes symptoms of sadness, hopelessness, and loss of interest in things. These symptoms last most of the day, almost every day, for 2 weeks. MDD can also cause physical symptoms. It can interfere with relationships and activities, such as work, school, and activities that are usually pleasant. MDD may be mild, moderate, or severe. It may be single-episode MDD, which happens once, or recurrent MDD, which may occur many times. What are the causes? The exact cause of this condition is not known. What increases the risk? The following factors may make someone more likely to develop MDD: A family history of depression. Being female. Long-term (chronic) stress, physical illness, other mental health disorders, or substance misuse. Trauma, including: Family problems. Violence or abuse. Loss of a parent or close family member. Experiencing discrimination. What are the signs or symptoms? The main symptoms of MDD usually include: Constant depressed or irritable mood. A loss of interest in activities. Sleeping or eating too much or too little. Tiredness or low energy. Other symptoms include: Unexplained weight gain or weight loss. Being agitated, restless, or weak. Feeling hopeless, worthless, or guilty. Trouble thinking clearly or making decisions. Thoughts of suicide or harming others. Spending a lot of time alone. Not being able to complete daily tasks or work. Severe symptoms of this condition may include: Psychotic depression.This may include false beliefs or delusions. It may also include seeing, hearing, tasting, smelling, or feeling things that are not real (hallucinations). Chronic depression or persistent  depressive disorder. This is low-level depression that lasts for at least 2 years. Melancholic depression, or feeling extremely sad and hopeless. Catatonic depression, which includes trouble speaking and trouble moving. Seasonal depression, which is caused by changes in the seasons. How is this diagnosed? This condition may be diagnosed based on: Your symptoms. Your medical and mental health history. A physical exam. Blood tests to rule out other conditions. MDD is confirmed if you have either a depressed mood or loss of interest and at least four other MDD symptoms, most of the day, nearly every day, in a 2-week period. How is this treated? This condition is usually treated by mental health professionals, such as psychologists, psychiatrists, and clinical social workers. You may need more than one type of treatment. Treatment may include: Psychotherapy, also called talk therapy or counseling. Types of psychotherapy include: Cognitive behavioral therapy (CBT). This teaches you to recognize unhealthy feelings, thoughts, and behaviors, and replace them with positive thoughts and actions. Interpersonal therapy (IPT). This helps you to improve the way you communicate with others or relate to them. Family therapy. This treatment includes members of your family. Medicines to treat anxiety and depression. These medicines help to balance the brain chemicals that affect your emotions. Lifestyle changes. You may be asked to: Limit alcohol use and avoid drug use. Get regular exercise. Get plenty of sleep. Make healthy eating choices. Spend more time outdoors. Brain stimulation. This may be done if symptoms are very severe and other treatments have not worked. Examples of this treatment are electroconvulsive therapy and transcranial magnetic stimulation. Follow these instructions at home: Alcohol use Do not drink alcohol if: Your health care provider tells you not to drink. You are pregnant, may be  pregnant, or are planning to  become pregnant. If you drink alcohol: Limit how much you have to: 0-1 drink a day for women 0-2 drinks a day for men. Know how much alcohol is in your drink. In the [place] one drink equals one 12 oz bottle of beer (355 mL), one 5 oz glass of wine (148 mL), or one 1 oz glass of hard liquor (44 mL). Activity Exercise regularly and spend time outdoors. Find activities that you enjoy and make time to do them. Find healthy ways to manage stress, such as: Meditation or deep breathing. Spending time in nature. Journaling. Return to your normal activities as told by your health care provider. Ask your health care provider what activities are safe for you. General instructions  Take over-the-counter and prescription medicines only as told by your health care provider. Discuss alcohol use with your health care provider. Alcohol can affect any antidepressant medicines you are taking. Discuss any drug use with your health care provider. Eat a healthy diet and get enough sleep. Consider joining a support group. Your health care provider may be able to recommend one. Keep all follow-up visits. It is important for your health care provider to check on your mood, behavior, and medicines. Your health care provider will make changes to your treatment as needed. Where to find more information [employer] on Mental Illness: nami[employer] of Mental Health: https://www.frey.org/ American Psychiatric Association: psychiatry.org Contact a health care provider if: Your symptoms get worse. You develop new symptoms. Get help right away if: You hurt yourself on purpose (self-harm). You have thoughts about hurting yourself or others. You have hallucinations. Get help right away if you feel like you may hurt yourself or others, or have thoughts about taking your own life. Go to your nearest emergency room or: Call 911. Call the [place] at  [phone] or 988. This is open 24 hours a day. Text the Crisis Text Line at 762-494-0006. This information is not intended to replace advice given to you by your health care provider. Make sure you discuss any questions you have with your health care provider. Document Revised: 10/02/2021 Document Reviewed: 10/02/2021 Elsevier Patient Education  [place].

## 2022-08-31 LAB — VITAMIN B1: Thiamine: 74.7 nmol/L (ref 66.5–200.0)

## 2022-09-02 ENCOUNTER — Telehealth: Payer: Self-pay | Admitting: Neurology

## 2022-09-02 ENCOUNTER — Encounter: Payer: Self-pay | Admitting: Neurology

## 2022-09-02 ENCOUNTER — Telehealth: Payer: Self-pay | Admitting: *Deleted

## 2022-09-02 NOTE — Telephone Encounter (Signed)
Spoke to patient gave labwork results . Pt preferred to come back to our office to get thiamine rechecked . Pt states will call back in a month to get make a  appointment . Pt states has appointment for EEG  in April . Pt thanked me for calling . Forward lab results to PCP

## 2022-09-02 NOTE — Telephone Encounter (Signed)
Pt called back. Stated she received a VM message to discuss labs. Pt is requesting a call back from nurse.

## 2022-09-02 NOTE — Progress Notes (Signed)
Thiamine is a little on the low side but within normal levels. I would have it checked again at next appointment wither with me or your primary care (fyi dr Ardelia Mems) non urgently thanks Thanks, dr Jaynee Eagles

## 2022-09-02 NOTE — Telephone Encounter (Signed)
See phone note

## 2022-09-02 NOTE — Telephone Encounter (Signed)
-----   Message from Melvenia Beam, MD sent at 09/02/2022 10:24 AM EDT ----- Thiamine is a little on the low side but within normal levels. I would have it checked again at next appointment wither with me or your primary care (fyi dr Ardelia Mems) non urgently thanks Thanks, dr Jaynee Eagles

## 2022-09-04 ENCOUNTER — Telehealth: Payer: Self-pay | Admitting: Neurology

## 2022-09-04 NOTE — Telephone Encounter (Signed)
Referral sent to Dr. Alphonzo Severance at [place]

## 2022-09-16 ENCOUNTER — Ambulatory Visit: Payer: Medicaid Other | Admitting: Neurology

## 2022-09-16 DIAGNOSIS — R413 Other amnesia: Secondary | ICD-10-CM

## 2022-09-16 DIAGNOSIS — R4182 Altered mental status, unspecified: Secondary | ICD-10-CM | POA: Diagnosis not present

## 2022-09-16 NOTE — Procedures (Signed)
    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   EEG classification: Awake and drowsy  Description of the recording: The background rhythms of this recording consists of a fairly well modulated medium amplitude alpha rhythm of 11 Hz that is reactive to eye opening and closure. Present in the anterior head region is a 15-20 Hz beta activity. Photic stimulation was performed, did not show any abnormalities. Hyperventilation was also performed, did not show any abnormalities. Drowsiness was manifested by background fragmentation. There were presence of left temporal sharp and slow waves discharges seen during this recording. There was no focal slowing. There were no electrographic seizure identified.   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    Windell Norfolk, MD [facility]

## 2022-09-19 ENCOUNTER — Encounter: Payer: Self-pay | Admitting: Neurology

## 2022-09-19 ENCOUNTER — Telehealth: Payer: Self-pay | Admitting: Neurology

## 2022-09-19 NOTE — Telephone Encounter (Signed)
See other phone note

## 2022-09-19 NOTE — Telephone Encounter (Signed)
Noted, thanks!

## 2022-09-19 NOTE — Telephone Encounter (Signed)
Pod4: Abnormal EEG in the office. Please order 48-hour ambulatory eeg, she would like that before she is started on AEDS. Dr. Teresa Coombs to read.

## 2022-09-19 NOTE — Telephone Encounter (Signed)
I called pt and relayed that Dr. Lucia Gaskins did put in order for her to neuropsychology Dr. Clayborn Heron ([facility]) for her to have some formal memory testing done.  Pt did get phone call back but wanted to make sure what it was for before calling back.  She will call them to get on schedule. She asked about the EEG results and  I relayed the EEG results per Dr. Lucia Gaskins.  "  We saw abnormalities on EEG. No seizures but definitely the brain waves had anormalities that could lead to seizures. I would like to treat you with an anti-epileptic medication and see if that helps with the symptoms possible you are having seizures.  if she is ok with it I will send in lamictal and send her information on side effects".    Pt verbalized understanding but did not want to start medications until after having VEEG.  I saw this recommendation in another note.  I relayed to pt will send order to [facility] (a home VEEG 48 hours).  I gave her their #.  They will contact her. She verbalized understanding. I answered her questions.  She will call back as needed.   [facility] Order completed to Dr. Lucia Gaskins to sign.

## 2022-09-19 NOTE — Telephone Encounter (Signed)
We saw abnormalities on EEG. No seizures but definitely the brain waves had anormalities that could lead to seizures. I would like to treat you with an anti-epileptic medication and see if that helps with the symptoms possible you are having seizures. I'll ask my nurse to call - see phone note, if she is ok with it I will send in lamictal and send her information on side effects

## 2022-09-19 NOTE — Telephone Encounter (Signed)
Fax received confirmation to [facility] [phone] for 48 hour VEEG.

## 2022-09-19 NOTE — Telephone Encounter (Signed)
Pt requesting a nurse call to discuss why a referral for neuropsychology was placed for her. Please call [phone]

## 2022-10-16 DIAGNOSIS — R9401 Abnormal electroencephalogram [EEG]: Secondary | ICD-10-CM | POA: Diagnosis not present

## 2022-10-16 DIAGNOSIS — R569 Unspecified convulsions: Secondary | ICD-10-CM | POA: Diagnosis not present

## 2022-10-17 DIAGNOSIS — R55 Syncope and collapse: Secondary | ICD-10-CM | POA: Diagnosis not present

## 2022-10-17 DIAGNOSIS — R413 Other amnesia: Secondary | ICD-10-CM | POA: Diagnosis not present

## 2022-10-17 DIAGNOSIS — R410 Disorientation, unspecified: Secondary | ICD-10-CM | POA: Diagnosis not present

## 2022-10-17 DIAGNOSIS — R4182 Altered mental status, unspecified: Secondary | ICD-10-CM | POA: Diagnosis not present

## 2022-10-17 DIAGNOSIS — R9401 Abnormal electroencephalogram [EEG]: Secondary | ICD-10-CM | POA: Diagnosis not present

## 2022-10-17 DIAGNOSIS — R569 Unspecified convulsions: Secondary | ICD-10-CM | POA: Diagnosis not present

## 2022-10-28 ENCOUNTER — Telehealth: Payer: Self-pay | Admitting: Neurology

## 2022-10-28 NOTE — Telephone Encounter (Signed)
Will get it completed by end of the week. Thanks

## 2022-10-28 NOTE — Telephone Encounter (Signed)
Pt is asking for a call with results to her EEG ?

## 2022-10-29 DIAGNOSIS — Z87820 Personal history of traumatic brain injury: Secondary | ICD-10-CM | POA: Diagnosis not present

## 2022-10-29 DIAGNOSIS — R419 Unspecified symptoms and signs involving cognitive functions and awareness: Secondary | ICD-10-CM | POA: Diagnosis not present

## 2022-10-31 ENCOUNTER — Other Ambulatory Visit: Payer: Self-pay | Admitting: Neurology

## 2022-10-31 DIAGNOSIS — R413 Other amnesia: Secondary | ICD-10-CM

## 2022-10-31 NOTE — Procedures (Signed)
Clinical History : This is a 47 y/o F who presents with memory issues that seem to be getting worse. Mom states that she can call and then call back and patient does not remember. She repeats things over and over. Her previous EEG was read as abnormal due to the presence of left temporal sharp and slow wave discharges.  INTERMITTENT MONITORING with VIDEO TECHNICAL SUMMARY: This AVEEG was performed using equipment provided by [employer] utilizing Bluetooth ( [employer] ) amplifiers with continuous EEGT attended video collection using encrypted remote transmission via [employer] secured cellular tower network with data rates for each AVEEG performed. This is a Therapist, music AVEEG, obtained, according to the 10-20 international electrode placement system, reformatted digitally into referential and bipolar montages. Data was acquired with a minimum of 21 bipolar connections and sampled at a minimum rate of 250 cycles per second per channel, maximum rate of 450 cycles per second per channel and two channels for EKG. The entire VEEG study was recorded through cable and or radio telemetry for subsequent analysis. Specified epochs of the AVEEG data were identified at the direction of the subject by the depression of a push button by the patient. Each patients event file included data acquired two minutes prior to the push button activation and continuing until two minutes afterwards. AVEEG files were reviewed on [facility] server, Licensed Software provided by [employer] with a digital high frequency filter set at 70 Hz and a low frequency filter set at 1 Hz with a paper speed of 69mm/s resulting in 10 seconds per digital page. This entire AVEEG was reviewed by the EEG Technologist. Random time samples, random sleep samples, clips, patient initiated push button files with included patient daily diary logs, EEG Technologist pruned data was reviewed and verified for accuracy and validity by the  governing reading neurologist in full details. This AEEGV was fully compliant with all requirements for CPT 97500 for setup, patient education, take down and administered by an EEG technologist. Long-Term EEG with Video was monitored intermittently by a qualified EEG technologist for the entirety of the recording; quality check-ins were performed at a minimum of every two hours, checking and documenting real-time data and video to assure the integrity and quality of the recording (e.g., camera position, electrode integrity and impedance), and identify the need for maintenance. For intermittent monitoring, an EEG Technologist monitored no more than 12 patients concurrently. Diagnostic video was captured at least 80% of the time during the recording.  PATIENT EVENTS: There was one patient events noted or captured during this recording. #1 Button pressed. Diary note: Nerves, anxiety. EEG shows awake background with no epileptiform changes. Patient is on camera.  TECHNOLOGIST EVENTS: There were presence of sharp and slow wave discharges seen throughout this recording.  TIME SAMPLES: 10-minutes of every 2 hours recorded are reviewed as random time samples.  SLEEP SAMPLES: 5-minutes of every 24 hours recorded are reviewed as random sleep samples.  AWAKE: At maximal level of alertness, the posterior dominant background activity was continuous, reactive, low voltage rhythm of 8-8.5 Hz. This was symmetric, well-modulated, and attenuated with eye opening. Diffuse, symmetric, frontocentral beta range activity was present.  SLEEP: N1 Sleep (Stage 1) was observed and characterized by the disappearance of alpha rhythm and the appearance of vertex activity. N2 Sleep (Stage 2) was observed and characterized by vertex waves, K-complexes, and sleep spindles. N3 (Stage 3) sleep was observed and characterized by high amplitude Delta activity of 20%. REM sleep was  observed.  EKG: There were no arrhythmias or  abnormalities noted during this recording.  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 There was one event described above with no changes in the EEG background. There were no seizures recorded.    Windell Norfolk, MD [facility]

## 2022-11-05 ENCOUNTER — Telehealth: Payer: Self-pay | Admitting: *Deleted

## 2022-11-05 ENCOUNTER — Other Ambulatory Visit: Payer: Self-pay | Admitting: Neurology

## 2022-11-05 MED ORDER — LAMOTRIGINE 25 MG PO TABS: ORAL_TABLET | ORAL | 3 refills | Status: DC

## 2022-11-05 NOTE — Telephone Encounter (Signed)
I spoke with the patient this afternoon and discussed her EEG results from her 48-hour EEG she had.  The patient understands that her EEG did not show any actual seizures but she had brain wave changes in the left temporal lobe that are concerning for potentially leading to seizures.  We discussed the patient starting lamotrigine 25 mg for seizure prevention and I reviewed the detailed titration instructions for her over the phone and also told her that it would be written on her bottle and I will send them through MyChart.  The patient would like a copy of her EEG result sent to her as well. She will ask her PCP for the MRI results.  I answered the patient's questions as I could.  She understands that she will be on this medication at least for the foreseeable future.  She would need to discuss with the doctor regarding how long she may be on this medication.  Patient wants to know if this abnormality is likely because of her brain injury she had before.  The patient states that she only ever had a seizure around 14 or 47 years old but otherwise no seizures. The patient is aware that if she develops any side effects, especially a rash, to let us know right away so we can instruct her what to do.

## 2022-11-05 NOTE — Telephone Encounter (Signed)
Pt stated she wants to know why Lamotrigine 25 was sent to the pharmacy for her today. She is requesting a call back from the nurse.

## 2022-11-05 NOTE — Telephone Encounter (Signed)
It is more likely than not that this new abnormality is the result of her TBI as her MRI shows evidence of brain inrury in the left frontal lobe

## 2022-11-05 NOTE — Telephone Encounter (Signed)
There is a more updated call about this.

## 2022-11-05 NOTE — Telephone Encounter (Signed)
Dr. Lucia Gaskins note: RE: her EEG.    09/19/22 12:59 PM Note We saw abnormalities on EEG. No seizures but definitely the brain waves had anormalities that could lead to seizures. I would like to treat you with an anti-epileptic medication and see if that helps with the symptoms possible you are having seizures. I'll ask my nurse to call - see phone note, if she is ok with it I will send in lamictal and send her information on side effects    Pt was informed of these results but wanted to wait until she had the VEEG done.  Her VEEG was done and read  per Dr. 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 There was one event described above with no changes in the EEG background. There were no seizures recorded.      Windell Norfolk, MD [facility]

## 2022-11-05 NOTE — Telephone Encounter (Signed)
Will start with Lamotrigine 25 mg daily for one week and increase to goal of 100 mg twice daily. I will send her a prescription.

## 2022-11-05 NOTE — Telephone Encounter (Signed)
Pt called wanting to know when she will get a call regarding her EEG results. Please advise.

## 2022-11-07 DIAGNOSIS — Z87820 Personal history of traumatic brain injury: Secondary | ICD-10-CM | POA: Diagnosis not present

## 2023-02-27 ENCOUNTER — Encounter: Payer: Medicaid Other | Admitting: Family Medicine

## 2023-03-12 ENCOUNTER — Other Ambulatory Visit: Payer: Self-pay | Admitting: Family Medicine

## 2023-03-12 DIAGNOSIS — J309 Allergic rhinitis, unspecified: Secondary | ICD-10-CM

## 2023-03-14 ENCOUNTER — Other Ambulatory Visit: Payer: Self-pay

## 2023-03-17 MED ORDER — NAPROXEN 500 MG PO TABS: 500.0000 mg | ORAL_TABLET | Freq: Every day | ORAL | 4 refills | Status: AC | PRN

## 2023-03-17 NOTE — Telephone Encounter (Signed)
Patient calls nurse line in regards to Naproxen refill.   Will forward to PCP.

## 2023-04-22 ENCOUNTER — Other Ambulatory Visit (HOSPITAL_COMMUNITY)
Admission: RE | Admit: 2023-04-22 | Discharge: 2023-04-22 | Disposition: A | Discharge status: Home / Self Care | Payer: Medicaid Other | Source: Ambulatory Visit | Attending: Family Medicine | Admitting: Family Medicine

## 2023-04-22 ENCOUNTER — Ambulatory Visit (INDEPENDENT_AMBULATORY_CARE_PROVIDER_SITE_OTHER): Payer: Medicaid Other | Admitting: Family Medicine

## 2023-04-22 ENCOUNTER — Encounter: Payer: Self-pay | Admitting: Family Medicine

## 2023-04-22 VITALS — BP 124/68 | HR 77 | Ht 61.5 in | Wt 158.0 lb

## 2023-04-22 DIAGNOSIS — Z3045 Encounter for surveillance of transdermal patch hormonal contraceptive device: Secondary | ICD-10-CM

## 2023-04-22 DIAGNOSIS — Z1211 Encounter for screening for malignant neoplasm of colon: Secondary | ICD-10-CM

## 2023-04-22 DIAGNOSIS — H1013 Acute atopic conjunctivitis, bilateral: Secondary | ICD-10-CM | POA: Diagnosis not present

## 2023-04-22 DIAGNOSIS — Z124 Encounter for screening for malignant neoplasm of cervix: Secondary | ICD-10-CM | POA: Diagnosis not present

## 2023-04-22 DIAGNOSIS — R9401 Abnormal electroencephalogram [EEG]: Secondary | ICD-10-CM

## 2023-04-22 DIAGNOSIS — Z0000 Encounter for general adult medical examination without abnormal findings: Secondary | ICD-10-CM | POA: Diagnosis not present

## 2023-04-22 DIAGNOSIS — J309 Allergic rhinitis, unspecified: Secondary | ICD-10-CM

## 2023-04-22 MED ORDER — NORELGESTROMIN-ETH ESTRADIOL 150-35 MCG/24HR TD PTWK: MEDICATED_PATCH | TRANSDERMAL | 4 refills | Status: AC

## 2023-04-22 MED ORDER — CETIRIZINE HCL 10 MG PO CHEW
10.0000 mg | CHEWABLE_TABLET | Freq: Every day | ORAL | 1 refills | Status: AC | PRN
Start: 2023-04-22 — End: ?

## 2023-04-22 MED ORDER — FLUTICASONE PROPIONATE 50 MCG/ACT NA SUSP
1.0000 | Freq: Every day | NASAL | 11 refills | Status: AC
Start: 2023-04-22 — End: ?

## 2023-04-22 NOTE — Patient Instructions (Addendum)
It was great to see you again today.  Schedule follow up with Dr. Lucia Gaskins Her office # is [phone]  Refilled zyrtec and flonase  Pap smear today  Refilled birth control patch  Ordered cologuard - it should come in the mail  Be well, Dr. Pollie Meyer  Health Maintenance, Female Adopting a healthy lifestyle and getting preventive care are important in promoting health and wellness. Ask your health care provider about: The right schedule for you to have regular tests and exams. Things you can do on your own to prevent diseases and keep yourself healthy. What should I know about diet, weight, and exercise? Eat a healthy diet  Eat a diet that includes plenty of vegetables, fruits, low-fat dairy products, and lean protein. Do not eat a lot of foods that are high in solid fats, added sugars, or sodium. Maintain a healthy weight Body mass index (BMI) is used to identify weight problems. It estimates body fat based on height and weight. Your health care provider can help determine your BMI and help you achieve or maintain a healthy weight. Get regular exercise Get regular exercise. This is one of the most important things you can do for your health. Most adults should: Exercise for at least 150 minutes each week. The exercise should increase your heart rate and make you sweat (moderate-intensity exercise). Do strengthening exercises at least twice a week. This is in addition to the moderate-intensity exercise. Spend less time sitting. Even light physical activity can be beneficial. Watch cholesterol and blood lipids Have your blood tested for lipids and cholesterol at 47 years of age, then have this test every 5 years. Have your cholesterol levels checked more often if: Your lipid or cholesterol levels are high. You are older than 47 years of age. You are at high risk for heart disease. What should I know about cancer screening? Depending on your health history and family history, you may  need to have cancer screening at various ages. This may include screening for: Breast cancer. Cervical cancer. Colorectal cancer. Skin cancer. Lung cancer. What should I know about heart disease, diabetes, and high blood pressure? Blood pressure and heart disease High blood pressure causes heart disease and increases the risk of stroke. This is more likely to develop in people who have high blood pressure readings or are overweight. Have your blood pressure checked: Every 3-5 years if you are 95-86 years of age. Every year if you are 18 years old or older. Diabetes Have regular diabetes screenings. This checks your fasting blood sugar level. Have the screening done: Once every three years after age 63 if you are at a normal weight and have a low risk for diabetes. More often and at a younger age if you are overweight or have a high risk for diabetes. What should I know about preventing infection? Hepatitis B If you have a higher risk for hepatitis B, you should be screened for this virus. Talk with your health care provider to find out if you are at risk for hepatitis B infection. Hepatitis C Testing is recommended for: Everyone born from [birth year] through 1965. Anyone with known risk factors for hepatitis C. Sexually transmitted infections (STIs) Get screened for STIs, including gonorrhea and chlamydia, if: You are sexually active and are younger than 47 years of age. You are older than 47 years of age and your health care provider tells you that you are at risk for this type of infection. Your sexual activity has changed since you  were last screened, and you are at increased risk for chlamydia or gonorrhea. Ask your health care provider if you are at risk. Ask your health care provider about whether you are at high risk for HIV. Your health care provider may recommend a prescription medicine to help prevent HIV infection. If you choose to take medicine to prevent HIV, you should first get  tested for HIV. You should then be tested every 3 months for as long as you are taking the medicine. Pregnancy If you are about to stop having your period (premenopausal) and you may become pregnant, seek counseling before you get pregnant. Take 400 to 800 micrograms (mcg) of folic acid every day if you become pregnant. Ask for birth control (contraception) if you want to prevent pregnancy. Osteoporosis and menopause Osteoporosis is a disease in which the bones lose minerals and strength with aging. This can result in bone fractures. If you are 41 years old or older, or if you are at risk for osteoporosis and fractures, ask your health care provider if you should: Be screened for bone loss. Take a calcium or vitamin D supplement to lower your risk of fractures. Be given hormone replacement therapy (HRT) to treat symptoms of menopause. Follow these instructions at home: Alcohol use Do not drink alcohol if: Your health care provider tells you not to drink. You are pregnant, may be pregnant, or are planning to become pregnant. If you drink alcohol: Limit how much you have to: 0-1 drink a day. Know how much alcohol is in your drink. In the [place] one drink equals one 12 oz bottle of beer (355 mL), one 5 oz glass of wine (148 mL), or one 1 oz glass of hard liquor (44 mL). Lifestyle Do not use any products that contain nicotine or tobacco. These products include cigarettes, chewing tobacco, and vaping devices, such as e-cigarettes. If you need help quitting, ask your health care provider. Do not use street drugs. Do not share needles. Ask your health care provider for help if you need support or information about quitting drugs. General instructions Schedule regular health, dental, and eye exams. Stay current with your vaccines. Tell your health care provider if: You often feel depressed. You have ever been abused or do not feel safe at home. Summary Adopting a healthy lifestyle and getting  preventive care are important in promoting health and wellness. Follow your health care provider's instructions about healthy diet, exercising, and getting tested or screened for diseases. Follow your health care provider's instructions on monitoring your cholesterol and blood pressure. This information is not intended to replace advice given to you by your health care provider. Make sure you discuss any questions you have with your health care provider. Document Revised: 10/16/2020 Document Reviewed: 10/16/2020 Elsevier Patient Education  2024 [employer].

## 2023-04-22 NOTE — Progress Notes (Unsigned)
  Date of Visit: 04/22/2023   SUBJECTIVE:   HPI:  Katherine Woods presents today for a well woman exam.   Concerns today: needs refill of zyrtec and flonase Periods: monthly periods without issues Contraception: abstinent but likes to have rx for patches on hand in case she becomes sexually active Pelvic symptoms: denies vaginal discharge or pelvic pain Sexual activity: abstinent STD Screening: declines today Pap smear status: UTD but desires yearly pap Exercise: walks some Smoking: no Alcohol: occasional, rare Drugs: no Mood: overall ok Dentist: hasn't been in a while Cancers in family: none  OBJECTIVE:   BP 124/68   Pulse 77   Ht 5' 1.5" (1.562 m)   Wt 158 lb (71.7 kg)   SpO2 100%   BMI 29.37 kg/m  Gen: NAD, pleasant, cooperative HEENT: NCAT, PERRL, no palpable thyromegaly or anterior cervical lymphadenopathy Heart: RRR, no murmurs Lungs: CTAB, NWOB Abdomen: soft, nontender to palpation Neuro: grossly nonfocal, speech normal GU: normal appearing external genitalia without lesions. Vagina is moist with white discharge. Cervix normal in appearance. No cervical motion tenderness or tenderness on bimanual exam. No adnexal masses.   ASSESSMENT/PLAN:   Assessment & Plan Routine adult health maintenance -STD screening: declined today -pap smear: UTD, again counseled patient that repeat pap is not indicated but she strongly prefers yearly cytology, obtained today -mammogram: UTD, reports last mammogram done at [facility] 2023, will request records -colon cancer screening: reviewed options, patient prefers cologuard, ordered -immunizations:  Flu: declines today Tdap: UTD COVID: declines -handout given on health maintenance topics Encounter for surveillance of transdermal patch hormonal contraceptive device Satisfied with patch, not currently using but desires rx in case becomes sexually active, rx sent in Allergic conjunctivitis of both eyes and rhinitis Stable, refill zyrtec  and flonase Abnormal EEG Advised follow up with neurology - provided phone # for that office so she can schedule  FOLLOW UP: Follow up in 1 year for next CPE  [place] J. Pollie Meyer, MD [facility]

## 2023-04-24 DIAGNOSIS — Z0000 Encounter for general adult medical examination without abnormal findings: Secondary | ICD-10-CM | POA: Insufficient documentation

## 2023-04-24 DIAGNOSIS — R9401 Abnormal electroencephalogram [EEG]: Secondary | ICD-10-CM | POA: Insufficient documentation

## 2023-04-24 LAB — CYTOLOGY - PAP
Adequacy: ABSENT
Diagnosis: NEGATIVE

## 2023-04-24 NOTE — Assessment & Plan Note (Signed)
Stable, refill zyrtec and flonase

## 2023-04-24 NOTE — Assessment & Plan Note (Signed)
Advised follow up with neurology - provided phone # for that office so she can schedule

## 2023-04-24 NOTE — Assessment & Plan Note (Signed)
Satisfied with patch, not currently using but desires rx in case becomes sexually active, rx sent in

## 2023-04-24 NOTE — Assessment & Plan Note (Signed)
-  STD screening: declined today -pap smear: UTD, again counseled patient that repeat pap is not indicated but she strongly prefers yearly cytology, obtained today -mammogram: UTD, reports last mammogram done at [facility] 2023, will request records -colon cancer screening: reviewed options, patient prefers cologuard, ordered -immunizations:  Flu: declines today Tdap: UTD COVID: declines -handout given on health maintenance topics

## 2024-03-12 LAB — HM MAMMOGRAPHY

## 2024-06-16 ENCOUNTER — Telehealth: Payer: Self-pay

## 2024-06-16 DIAGNOSIS — Z1211 Encounter for screening for malignant neoplasm of colon: Secondary | ICD-10-CM

## 2024-06-16 NOTE — Telephone Encounter (Signed)
 Patient calls nurse line requesting a cologuard order.   Advised will forward to PCP.

## 2024-06-18 NOTE — Telephone Encounter (Signed)
 Ordered. Please also ask patient to schedule appointment with me, it's been >1 year since she's been seen.  [place] JINNY Legions, MD

## 2024-07-06 LAB — COLOGUARD: COLOGUARD: NEGATIVE
